# Patient Record
Sex: Female | Born: 1965 | Race: White | Hispanic: No | Smoking: Never smoker
Health system: Southern US, Community
[De-identification: ages and names within clinical notes are randomized; demographics above are authoritative.]

## PROBLEM LIST (undated history)

## (undated) DIAGNOSIS — R519 Headache, unspecified: Secondary | ICD-10-CM

## (undated) DIAGNOSIS — Z9889 Other specified postprocedural states: Secondary | ICD-10-CM

## (undated) DIAGNOSIS — K649 Unspecified hemorrhoids: Secondary | ICD-10-CM

## (undated) DIAGNOSIS — N938 Other specified abnormal uterine and vaginal bleeding: Secondary | ICD-10-CM

## (undated) DIAGNOSIS — F419 Anxiety disorder, unspecified: Secondary | ICD-10-CM

## (undated) DIAGNOSIS — R51 Headache: Secondary | ICD-10-CM

## (undated) DIAGNOSIS — E669 Obesity, unspecified: Secondary | ICD-10-CM

## (undated) DIAGNOSIS — Z973 Presence of spectacles and contact lenses: Secondary | ICD-10-CM

## (undated) DIAGNOSIS — N809 Endometriosis, unspecified: Secondary | ICD-10-CM

## (undated) DIAGNOSIS — R569 Unspecified convulsions: Secondary | ICD-10-CM

## (undated) DIAGNOSIS — R112 Nausea with vomiting, unspecified: Secondary | ICD-10-CM

## (undated) HISTORY — PX: BREAST SURGERY: SHX581

## (undated) HISTORY — PX: ABDOMINAL HYSTERECTOMY: SHX81

## (undated) HISTORY — PX: ABDOMINAL HYSTERECTOMY: SUR658

## (undated) HISTORY — PX: MULTIPLE TOOTH EXTRACTIONS: SHX2053

## (undated) HISTORY — PX: TONSILLECTOMY AND ADENOIDECTOMY: SHX28

## (undated) HISTORY — PX: WISDOM TOOTH EXTRACTION: SHX21

## (undated) HISTORY — DX: Unspecified hemorrhoids: K64.9

## (undated) HISTORY — PX: OTHER SURGICAL HISTORY: SHX169

## (undated) HISTORY — DX: Other specified abnormal uterine and vaginal bleeding: N93.8

## (undated) HISTORY — DX: Obesity, unspecified: E66.9

## (undated) HISTORY — DX: Headache, unspecified: R51.9

## (undated) HISTORY — PX: BREAST EXCISIONAL BIOPSY: SUR124

---

## 1997-08-13 ENCOUNTER — Emergency Department (HOSPITAL_COMMUNITY): Admission: EM | Admit: 1997-08-13 | Discharge: 1997-08-14 | Payer: Self-pay | Admitting: Family Medicine

## 1998-02-28 ENCOUNTER — Emergency Department (HOSPITAL_COMMUNITY): Admission: EM | Admit: 1998-02-28 | Discharge: 1998-02-28 | Payer: Self-pay | Admitting: Emergency Medicine

## 1998-03-02 ENCOUNTER — Emergency Department (HOSPITAL_COMMUNITY): Admission: EM | Admit: 1998-03-02 | Discharge: 1998-03-02 | Payer: Self-pay | Admitting: Emergency Medicine

## 1998-05-03 ENCOUNTER — Inpatient Hospital Stay (HOSPITAL_COMMUNITY): Admission: AD | Admit: 1998-05-03 | Discharge: 1998-05-03 | Payer: Self-pay | Admitting: Obstetrics

## 1998-09-10 ENCOUNTER — Emergency Department (HOSPITAL_COMMUNITY): Admission: EM | Admit: 1998-09-10 | Discharge: 1998-09-10 | Payer: Self-pay | Admitting: Emergency Medicine

## 1998-11-03 ENCOUNTER — Emergency Department (HOSPITAL_COMMUNITY): Admission: EM | Admit: 1998-11-03 | Discharge: 1998-11-04 | Payer: Self-pay | Admitting: Internal Medicine

## 1998-11-14 ENCOUNTER — Emergency Department (HOSPITAL_COMMUNITY): Admission: EM | Admit: 1998-11-14 | Discharge: 1998-11-14 | Payer: Self-pay | Admitting: *Deleted

## 1999-05-13 ENCOUNTER — Emergency Department (HOSPITAL_COMMUNITY): Admission: EM | Admit: 1999-05-13 | Discharge: 1999-05-13 | Payer: Self-pay

## 1999-08-07 ENCOUNTER — Emergency Department (HOSPITAL_COMMUNITY): Admission: EM | Admit: 1999-08-07 | Discharge: 1999-08-07 | Payer: Self-pay | Admitting: Internal Medicine

## 1999-10-05 ENCOUNTER — Emergency Department (HOSPITAL_COMMUNITY): Admission: EM | Admit: 1999-10-05 | Discharge: 1999-10-05 | Payer: Self-pay | Admitting: Emergency Medicine

## 1999-12-28 ENCOUNTER — Encounter: Admission: RE | Admit: 1999-12-28 | Discharge: 1999-12-28 | Payer: Self-pay | Admitting: Obstetrics & Gynecology

## 2000-03-23 ENCOUNTER — Emergency Department (HOSPITAL_COMMUNITY): Admission: EM | Admit: 2000-03-23 | Discharge: 2000-03-23 | Payer: Self-pay | Admitting: *Deleted

## 2000-03-23 ENCOUNTER — Encounter: Payer: Self-pay | Admitting: *Deleted

## 2000-03-25 ENCOUNTER — Emergency Department (HOSPITAL_COMMUNITY): Admission: EM | Admit: 2000-03-25 | Discharge: 2000-03-25 | Payer: Self-pay | Admitting: Emergency Medicine

## 2000-12-16 ENCOUNTER — Encounter: Admission: RE | Admit: 2000-12-16 | Discharge: 2000-12-16 | Payer: Self-pay

## 2001-02-06 ENCOUNTER — Encounter: Admission: RE | Admit: 2001-02-06 | Discharge: 2001-02-06 | Payer: Self-pay | Admitting: Obstetrics & Gynecology

## 2001-03-03 ENCOUNTER — Encounter: Admission: RE | Admit: 2001-03-03 | Discharge: 2001-03-03 | Payer: Self-pay | Admitting: Obstetrics & Gynecology

## 2001-03-06 ENCOUNTER — Encounter: Admission: RE | Admit: 2001-03-06 | Discharge: 2001-03-06 | Payer: Self-pay | Admitting: Obstetrics & Gynecology

## 2001-03-23 ENCOUNTER — Inpatient Hospital Stay (HOSPITAL_COMMUNITY): Admission: AD | Admit: 2001-03-23 | Discharge: 2001-03-23 | Payer: Self-pay | Admitting: Obstetrics

## 2001-03-27 ENCOUNTER — Inpatient Hospital Stay (HOSPITAL_COMMUNITY): Admission: AD | Admit: 2001-03-27 | Discharge: 2001-03-27 | Payer: Self-pay | Admitting: Obstetrics & Gynecology

## 2001-05-07 ENCOUNTER — Encounter: Admission: RE | Admit: 2001-05-07 | Discharge: 2001-05-07 | Payer: Self-pay | Admitting: Obstetrics and Gynecology

## 2002-11-23 ENCOUNTER — Emergency Department (HOSPITAL_COMMUNITY): Admission: EM | Admit: 2002-11-23 | Discharge: 2002-11-23 | Payer: Self-pay | Admitting: Emergency Medicine

## 2002-11-23 ENCOUNTER — Encounter: Payer: Self-pay | Admitting: Emergency Medicine

## 2003-01-08 ENCOUNTER — Emergency Department (HOSPITAL_COMMUNITY): Admission: EM | Admit: 2003-01-08 | Discharge: 2003-01-08 | Payer: Self-pay | Admitting: Emergency Medicine

## 2003-05-06 ENCOUNTER — Inpatient Hospital Stay (HOSPITAL_COMMUNITY): Admission: AD | Admit: 2003-05-06 | Discharge: 2003-05-06 | Payer: Self-pay | Admitting: *Deleted

## 2003-05-11 ENCOUNTER — Inpatient Hospital Stay (HOSPITAL_COMMUNITY): Admission: AD | Admit: 2003-05-11 | Discharge: 2003-05-11 | Payer: Self-pay | Admitting: Obstetrics and Gynecology

## 2004-02-29 ENCOUNTER — Emergency Department (HOSPITAL_COMMUNITY): Admission: EM | Admit: 2004-02-29 | Discharge: 2004-03-01 | Payer: Self-pay | Admitting: Emergency Medicine

## 2004-03-10 ENCOUNTER — Inpatient Hospital Stay (HOSPITAL_COMMUNITY): Admission: AD | Admit: 2004-03-10 | Discharge: 2004-03-10 | Payer: Self-pay | Admitting: *Deleted

## 2004-03-27 ENCOUNTER — Ambulatory Visit: Payer: Self-pay | Admitting: Obstetrics & Gynecology

## 2004-04-12 ENCOUNTER — Emergency Department (HOSPITAL_COMMUNITY): Admission: EM | Admit: 2004-04-12 | Discharge: 2004-04-12 | Payer: Self-pay | Admitting: Family Medicine

## 2004-04-16 ENCOUNTER — Ambulatory Visit: Payer: Self-pay | Admitting: Internal Medicine

## 2004-11-25 HISTORY — PX: LAPAROSCOPY: SHX197

## 2004-11-26 ENCOUNTER — Ambulatory Visit: Payer: Self-pay | Admitting: Hospitalist

## 2004-11-28 ENCOUNTER — Inpatient Hospital Stay (HOSPITAL_COMMUNITY): Admission: AD | Admit: 2004-11-28 | Discharge: 2004-11-28 | Payer: Self-pay | Admitting: Obstetrics and Gynecology

## 2004-11-30 ENCOUNTER — Ambulatory Visit (HOSPITAL_COMMUNITY): Admission: AD | Admit: 2004-11-30 | Discharge: 2004-11-30 | Payer: Self-pay | Admitting: *Deleted

## 2004-11-30 ENCOUNTER — Ambulatory Visit: Payer: Self-pay | Admitting: *Deleted

## 2004-12-03 ENCOUNTER — Inpatient Hospital Stay (HOSPITAL_COMMUNITY): Admission: AD | Admit: 2004-12-03 | Discharge: 2004-12-03 | Payer: Self-pay | Admitting: Obstetrics & Gynecology

## 2004-12-06 ENCOUNTER — Ambulatory Visit: Payer: Self-pay | Admitting: Family Medicine

## 2004-12-06 ENCOUNTER — Ambulatory Visit (HOSPITAL_COMMUNITY): Admission: RE | Admit: 2004-12-06 | Discharge: 2004-12-06 | Payer: Self-pay | Admitting: Family Medicine

## 2004-12-12 ENCOUNTER — Inpatient Hospital Stay (HOSPITAL_COMMUNITY): Admission: AD | Admit: 2004-12-12 | Discharge: 2004-12-12 | Payer: Self-pay | Admitting: Obstetrics and Gynecology

## 2004-12-19 ENCOUNTER — Ambulatory Visit: Payer: Self-pay | Admitting: *Deleted

## 2004-12-24 ENCOUNTER — Inpatient Hospital Stay (HOSPITAL_COMMUNITY): Admission: AD | Admit: 2004-12-24 | Discharge: 2004-12-24 | Payer: Self-pay | Admitting: Family Medicine

## 2005-01-03 ENCOUNTER — Ambulatory Visit: Payer: Self-pay | Admitting: Family Medicine

## 2005-01-03 ENCOUNTER — Ambulatory Visit (HOSPITAL_COMMUNITY): Admission: RE | Admit: 2005-01-03 | Discharge: 2005-01-03 | Payer: Self-pay | Admitting: Obstetrics and Gynecology

## 2005-01-05 ENCOUNTER — Inpatient Hospital Stay (HOSPITAL_COMMUNITY): Admission: AD | Admit: 2005-01-05 | Discharge: 2005-01-05 | Payer: Self-pay | Admitting: Obstetrics and Gynecology

## 2005-01-07 ENCOUNTER — Ambulatory Visit: Payer: Self-pay | Admitting: Obstetrics and Gynecology

## 2005-01-24 ENCOUNTER — Ambulatory Visit: Payer: Self-pay | Admitting: Family Medicine

## 2005-02-03 ENCOUNTER — Inpatient Hospital Stay (HOSPITAL_COMMUNITY): Admission: AD | Admit: 2005-02-03 | Discharge: 2005-02-03 | Payer: Self-pay | Admitting: *Deleted

## 2005-02-05 ENCOUNTER — Inpatient Hospital Stay (HOSPITAL_COMMUNITY): Admission: AD | Admit: 2005-02-05 | Discharge: 2005-02-05 | Payer: Self-pay | Admitting: *Deleted

## 2005-02-13 ENCOUNTER — Ambulatory Visit (HOSPITAL_COMMUNITY): Admission: RE | Admit: 2005-02-13 | Discharge: 2005-02-13 | Payer: Self-pay | Admitting: *Deleted

## 2005-02-13 ENCOUNTER — Ambulatory Visit: Payer: Self-pay | Admitting: *Deleted

## 2005-02-21 ENCOUNTER — Ambulatory Visit: Payer: Self-pay | Admitting: Family Medicine

## 2005-03-04 ENCOUNTER — Ambulatory Visit: Payer: Self-pay | Admitting: Obstetrics & Gynecology

## 2005-03-04 ENCOUNTER — Inpatient Hospital Stay (HOSPITAL_COMMUNITY): Admission: AD | Admit: 2005-03-04 | Discharge: 2005-03-04 | Payer: Self-pay | Admitting: Obstetrics & Gynecology

## 2005-03-14 ENCOUNTER — Ambulatory Visit: Payer: Self-pay | Admitting: Family Medicine

## 2005-03-14 ENCOUNTER — Ambulatory Visit (HOSPITAL_COMMUNITY): Admission: RE | Admit: 2005-03-14 | Discharge: 2005-03-14 | Payer: Self-pay | Admitting: *Deleted

## 2005-04-01 ENCOUNTER — Inpatient Hospital Stay (HOSPITAL_COMMUNITY): Admission: AD | Admit: 2005-04-01 | Discharge: 2005-04-01 | Payer: Self-pay | Admitting: Obstetrics and Gynecology

## 2005-04-04 ENCOUNTER — Ambulatory Visit: Payer: Self-pay | Admitting: Family Medicine

## 2005-04-25 ENCOUNTER — Ambulatory Visit: Payer: Self-pay | Admitting: Family Medicine

## 2005-05-09 ENCOUNTER — Ambulatory Visit: Payer: Self-pay | Admitting: *Deleted

## 2005-05-19 ENCOUNTER — Ambulatory Visit: Payer: Self-pay | Admitting: Obstetrics & Gynecology

## 2005-05-19 ENCOUNTER — Inpatient Hospital Stay (HOSPITAL_COMMUNITY): Admission: AD | Admit: 2005-05-19 | Discharge: 2005-05-19 | Payer: Self-pay | Admitting: Obstetrics & Gynecology

## 2005-05-23 ENCOUNTER — Ambulatory Visit: Payer: Self-pay | Admitting: Gynecology

## 2005-05-27 ENCOUNTER — Ambulatory Visit: Payer: Self-pay | Admitting: Obstetrics and Gynecology

## 2005-05-27 ENCOUNTER — Inpatient Hospital Stay (HOSPITAL_COMMUNITY): Admission: AD | Admit: 2005-05-27 | Discharge: 2005-05-27 | Payer: Self-pay | Admitting: *Deleted

## 2005-05-30 ENCOUNTER — Ambulatory Visit (HOSPITAL_COMMUNITY): Admission: RE | Admit: 2005-05-30 | Discharge: 2005-05-30 | Payer: Self-pay | Admitting: *Deleted

## 2005-05-30 ENCOUNTER — Ambulatory Visit: Payer: Self-pay | Admitting: Gynecology

## 2005-06-06 ENCOUNTER — Ambulatory Visit: Payer: Self-pay | Admitting: *Deleted

## 2005-06-10 ENCOUNTER — Inpatient Hospital Stay (HOSPITAL_COMMUNITY): Admission: AD | Admit: 2005-06-10 | Discharge: 2005-06-10 | Payer: Self-pay | Admitting: *Deleted

## 2005-06-10 ENCOUNTER — Ambulatory Visit: Payer: Self-pay | Admitting: Obstetrics and Gynecology

## 2005-06-13 ENCOUNTER — Ambulatory Visit: Payer: Self-pay | Admitting: Gynecology

## 2005-06-19 ENCOUNTER — Ambulatory Visit: Payer: Self-pay | Admitting: Family Medicine

## 2005-06-19 ENCOUNTER — Inpatient Hospital Stay (HOSPITAL_COMMUNITY): Admission: AD | Admit: 2005-06-19 | Discharge: 2005-06-19 | Payer: Self-pay | Admitting: Gynecology

## 2005-06-20 ENCOUNTER — Ambulatory Visit: Payer: Self-pay | Admitting: *Deleted

## 2005-06-26 ENCOUNTER — Ambulatory Visit (HOSPITAL_COMMUNITY): Admission: RE | Admit: 2005-06-26 | Discharge: 2005-06-26 | Payer: Self-pay | Admitting: Gynecology

## 2005-06-27 ENCOUNTER — Ambulatory Visit: Payer: Self-pay | Admitting: Gynecology

## 2005-07-04 ENCOUNTER — Ambulatory Visit: Payer: Self-pay | Admitting: Gynecology

## 2005-07-07 ENCOUNTER — Inpatient Hospital Stay (HOSPITAL_COMMUNITY): Admission: AD | Admit: 2005-07-07 | Discharge: 2005-07-07 | Payer: Self-pay | Admitting: Obstetrics and Gynecology

## 2005-07-11 ENCOUNTER — Ambulatory Visit: Payer: Self-pay | Admitting: Gynecology

## 2005-07-15 ENCOUNTER — Ambulatory Visit: Payer: Self-pay | Admitting: Obstetrics & Gynecology

## 2005-07-16 ENCOUNTER — Inpatient Hospital Stay (HOSPITAL_COMMUNITY): Admission: AD | Admit: 2005-07-16 | Discharge: 2005-07-16 | Payer: Self-pay | Admitting: Obstetrics and Gynecology

## 2005-07-17 ENCOUNTER — Inpatient Hospital Stay (HOSPITAL_COMMUNITY): Admission: AD | Admit: 2005-07-17 | Discharge: 2005-07-17 | Payer: Self-pay | Admitting: Family Medicine

## 2005-07-17 ENCOUNTER — Ambulatory Visit: Payer: Self-pay | Admitting: Obstetrics and Gynecology

## 2005-07-18 ENCOUNTER — Ambulatory Visit: Payer: Self-pay | Admitting: Family Medicine

## 2005-07-18 ENCOUNTER — Ambulatory Visit (HOSPITAL_COMMUNITY): Admission: RE | Admit: 2005-07-18 | Discharge: 2005-07-18 | Payer: Self-pay | Admitting: Gynecology

## 2005-07-23 ENCOUNTER — Ambulatory Visit: Payer: Self-pay | Admitting: Obstetrics and Gynecology

## 2005-07-24 ENCOUNTER — Inpatient Hospital Stay (HOSPITAL_COMMUNITY): Admission: AD | Admit: 2005-07-24 | Discharge: 2005-07-24 | Payer: Self-pay | Admitting: Gynecology

## 2005-07-24 ENCOUNTER — Ambulatory Visit: Payer: Self-pay | Admitting: *Deleted

## 2005-07-25 ENCOUNTER — Ambulatory Visit: Payer: Self-pay | Admitting: Gynecology

## 2005-07-25 ENCOUNTER — Inpatient Hospital Stay (HOSPITAL_COMMUNITY): Admission: AD | Admit: 2005-07-25 | Discharge: 2005-07-28 | Payer: Self-pay | Admitting: Family Medicine

## 2005-07-25 ENCOUNTER — Ambulatory Visit: Payer: Self-pay | Admitting: Obstetrics and Gynecology

## 2005-07-26 HISTORY — PX: TUBAL LIGATION: SHX77

## 2005-07-27 ENCOUNTER — Encounter (INDEPENDENT_AMBULATORY_CARE_PROVIDER_SITE_OTHER): Payer: Self-pay | Admitting: *Deleted

## 2006-02-18 ENCOUNTER — Emergency Department (HOSPITAL_COMMUNITY): Admission: EM | Admit: 2006-02-18 | Discharge: 2006-02-18 | Payer: Self-pay | Admitting: Emergency Medicine

## 2006-05-30 ENCOUNTER — Emergency Department (HOSPITAL_COMMUNITY): Admission: EM | Admit: 2006-05-30 | Discharge: 2006-05-30 | Payer: Self-pay | Admitting: Emergency Medicine

## 2006-12-09 ENCOUNTER — Inpatient Hospital Stay (HOSPITAL_COMMUNITY): Admission: AD | Admit: 2006-12-09 | Discharge: 2006-12-09 | Payer: Self-pay | Admitting: Obstetrics & Gynecology

## 2007-03-06 ENCOUNTER — Emergency Department (HOSPITAL_COMMUNITY): Admission: EM | Admit: 2007-03-06 | Discharge: 2007-03-06 | Payer: Self-pay | Admitting: Emergency Medicine

## 2007-04-03 ENCOUNTER — Inpatient Hospital Stay (HOSPITAL_COMMUNITY): Admission: AD | Admit: 2007-04-03 | Discharge: 2007-04-03 | Payer: Self-pay | Admitting: Obstetrics & Gynecology

## 2007-10-17 ENCOUNTER — Emergency Department (HOSPITAL_COMMUNITY): Admission: EM | Admit: 2007-10-17 | Discharge: 2007-10-17 | Payer: Self-pay | Admitting: Emergency Medicine

## 2010-07-13 NOTE — Op Note (Signed)
Erin Travis, Erin Travis              ACCOUNT NO.:  [id number]   MEDICAL RECORD NO.:  [id number]          PATIENT TYPE:  AMB   LOCATION:  SDC                           FACILITY:  [facility]   PHYSICIAN:  Ginger Carne, MD  DATE OF BIRTH:  25-Aug-1965   DATE OF PROCEDURE:  11/30/2004  DATE OF DISCHARGE:                                 OPERATIVE REPORT   PREOPERATIVE DIAGNOSIS:  Possible left tubal pregnancy.   POSTOPERATIVE DIAGNOSES:  1.  Normal pelvis.  2.  4-5 week intrauterine pregnancy.   PROCEDURE:  Diagnostic laparoscopy.   SURGEON:  Ginger Carne, MD.   ASSISTANT:  None.   COMPLICATIONS:  None immediate.   ESTIMATED BLOOD LOSS:  Minimal.   ANESTHESIA:  General.   FINDINGS:  The external genitalia, vulva and vagina normal. Cervix smooth  without erosions or lesions. The uterus appeared to be soft, about 4-6 weeks  in size. Both tubes and ovaries demonstrated normal decidual changes of  pregnancy. Both tubes were observed from their isthmus to the fimbriated end  without evidence of dilation to suggest a tubal pregnancy. The cul-de-sac  was normal, no free blood noted. Upper abdomen normal.   DESCRIPTION OF PROCEDURE:  The patient was prepped and draped in the usual  fashion and placed in the lithotomy position. Betadine solution used for  antiseptic and the patient was catheterized prior to procedure. After  adequate general anesthesia, a vertical infraumbilical incision was made and  the Veress needle placed in the abdomen. Opening and closing pressures were  10-15 mmHg. A needle release trocar placed in the same incision, laparoscope  placed in the trocar sleeve. Two 5 mm ports were made in the left lower  quadrant and left hypogastric regions. Inspection of the pelvic contents was  carried out. Photography taken after which gas release trocar was removed,  closure of the 10 mm fascial site with #0 Vicryl suture and 4-0 Vicryl for  subcuticular closure. Instrument  and sponge count were correct. The patient  tolerated the procedure well and was transferred to post anesthesia recovery  room in excellent condition.     Ginger Carne, MD  Electronically Signed    SHB/MEDQ  D:  11/30/2004  T:  11/30/2004  Job:  [id number]

## 2010-07-13 NOTE — Op Note (Signed)
Erin Travis, Erin Travis              ACCOUNT NO.:  [id number]   MEDICAL RECORD NO.:  [id number]          PATIENT TYPE:  INP   LOCATION:                                [facility]ILITY:  [facility]   PHYSICIAN:  Phil D. Okey Dupre, M.D.     DATE OF BIRTH:  06-17-1965   DATE OF PROCEDURE:  07/27/2005  DATE OF DISCHARGE:                                 OPERATIVE REPORT   PROCEDURE:  Bilateral tubal ligation and partial bilateral salpingectomy,  modified [place].   PREOPERATIVE DIAGNOSIS:  Voluntary sterilization.   POSTOPERATIVE DIAGNOSIS:  Voluntary sterilization.   SURGEON:  Dr. Okey Dupre.   ANESTHESIA:  Epidural plus local pathology.   SPECIMENS:  Partial section of each fallopian tube.   ESTIMATED BLOOD LOSS:  Five mL.   POSTOPERATIVE CONDITION:  Satisfactory.   PROCEDURE:  Under satisfactory epidural anesthesia with the patient in  dorsal supine position, the epidural was supplemented by 1/2% Marcaine  injection 20 mL into the surgical site just below the umbilicus after the  abdomen had been prepped and draped in the usual sterile manner.  The  abdomen was entered through a 4 cm subumbilical incision situated  approximately 1 cm below the umbilicus and taken down to the fascia.  When  the fascia was opened transversely, the peritoneum was then opened by sharp  dissection.  The fallopian tubes were easily identified, grasped with  Babcock clamps, followed down to the fimbriated end of each tube for certain  identification and a hemostat placed through an avascular portion of the  broad ligament through which was drawn a one plain catgut suture and tied  down around the distal and proximal ends of the tube to form a loop above  the tie of approximately 2 cm in length.  A second tie was placed just below  the aforementioned tie and cut short.  A section of tube above the ties was  excised and the free ends coagulated with hot cautery.  No bleeding was  noted and the tubes were returned into the  peritoneal cavity.  The  peritoneum was closed with continuous running zero Vicryl which was  continued up to a fascial closure before being tied.  Skin edges were then  approximated with a 3-0 Monocryl suture supplemented by Dermaplast.  Dry  sterile dressings applied.  The patient tolerated the procedure well and was  transferred recovery room in satisfactory condition.           ______________________________  Javier Glazier Okey Dupre, M.D.     PDR/MEDQ  D:  07/27/2005  T:  07/29/2005  Job:  [id number]

## 2010-07-13 NOTE — Group Therapy Note (Signed)
NAMEDEVENEY, BAYON NO.:  [id number]   MEDICAL RECORD NO.:  [id number]          PATIENT TYPE:  WOC   LOCATION:  [facility]                   FACILITY:  [facility]   PHYSICIAN:  Argentina Donovan, MD        DATE OF BIRTH:  Jul 14, 1965   DATE OF SERVICE:  03/27/2004                                    CLINIC NOTE   REASON FOR VISIT:  The patient is a 45 year old white female gravida 1 para  1-0-0-1 with a child age 16 who is seen in the MAU because of dysfunctional  uterine bleeding.  Her heavy periods have not been reflected in her  hemoglobin, which has been normal.  She talks about bloating, pelvic pain  prior to her period, and then a relief when the period finally comes, but  then the period goes on for 7-8 days and is very heavy.  She is a nonsmoker  but has been told that when she goes on birth control pills her blood  pressure goes up.  She was on Depo-Provera and bled the entire time.  We  have discussed surgery versus treatment with oral contraceptives.  I thought  that we would probably start her on oral contraceptives on a higher dose,  Ovcon-35, to make sure this would control the bleeding, and at the same time  start her on hydrochlorothiazide 25 for blood pressure control.  We are  going to have her come back for blood pressure check and discussion in  approximately 1 month after she has been on the medications.  Ultrasound of  the pelvis is completely normal.  The abdomen is soft, flat, nontender.  No  masses, no organomegaly.  The external genitalia is normal, BUS within  normal limits.  The vagina is clean and well rugated, the cervix is clean  and parous.  The uterus is anterior, normal size, shape, consistency, and  adnexa is normal.   My impression is this patient has dysfunctional uterine bleeding with PMDD.  It may be necessary to add Prozac or some other antidepressant along with  the other medications and adjust the oral contraceptives and the blood  pressure medication when she is re-seen.      PR/MEDQ  D:  03/27/2004  T:  03/27/2004  Job:  [id number]

## 2010-11-16 LAB — URINALYSIS, ROUTINE W REFLEX MICROSCOPIC
Glucose, UA: NEGATIVE
Ketones, ur: NEGATIVE
Nitrite: NEGATIVE
Protein, ur: NEGATIVE
Specific Gravity, Urine: >1.03 — ABNORMAL HIGH
Urobilinogen, UA: 0.2
pH: 6.5

## 2010-11-16 LAB — URINE MICROSCOPIC-ADD ON

## 2010-11-16 LAB — HERPES SIMPLEX VIRUS CULTURE: Culture: NOT DETECTED

## 2010-11-16 LAB — GC/CHLAMYDIA PROBE AMP, GENITAL
Chlamydia, DNA Probe: NEGATIVE
GC Probe Amp, Genital: NEGATIVE

## 2010-11-16 LAB — WET PREP, GENITAL
Clue Cells Wet Prep HPF POC: NONE SEEN
Trich, Wet Prep: NONE SEEN

## 2010-12-06 LAB — URINALYSIS, ROUTINE W REFLEX MICROSCOPIC
Bilirubin Urine: NEGATIVE
Glucose, UA: NEGATIVE
Ketones, ur: NEGATIVE
Leukocytes, UA: NEGATIVE
Nitrite: NEGATIVE
Protein, ur: NEGATIVE
Specific Gravity, Urine: 1.025
Urobilinogen, UA: 0.2
pH: 5.5

## 2010-12-06 LAB — WET PREP, GENITAL
Clue Cells Wet Prep HPF POC: NONE SEEN
Trich, Wet Prep: NONE SEEN
Yeast Wet Prep HPF POC: NONE SEEN

## 2010-12-06 LAB — URINE MICROSCOPIC-ADD ON

## 2010-12-06 LAB — CBC
HCT: 34.1 — ABNORMAL LOW
Hemoglobin: 11.3 — ABNORMAL LOW
MCHC: 33.3
MCV: 79.2
Platelets: 289
RBC: 4.3
RDW: 15.9 — ABNORMAL HIGH
WBC: 7.8

## 2010-12-06 LAB — POCT PREGNANCY, URINE
Operator id: 113551
Preg Test, Ur: NEGATIVE

## 2010-12-06 LAB — GC/CHLAMYDIA PROBE AMP, GENITAL
Chlamydia, DNA Probe: NEGATIVE
GC Probe Amp, Genital: NEGATIVE

## 2010-12-27 ENCOUNTER — Inpatient Hospital Stay (HOSPITAL_COMMUNITY)
Admission: AD | Admit: 2010-12-27 | Discharge: 2010-12-28 | Disposition: A | Discharge status: Home / Self Care | Payer: Self-pay | Source: Ambulatory Visit | Attending: Obstetrics & Gynecology | Admitting: Obstetrics & Gynecology

## 2010-12-27 ENCOUNTER — Encounter (HOSPITAL_COMMUNITY): Payer: Self-pay | Admitting: *Deleted

## 2010-12-27 DIAGNOSIS — N938 Other specified abnormal uterine and vaginal bleeding: Secondary | ICD-10-CM | POA: Insufficient documentation

## 2010-12-27 DIAGNOSIS — N949 Unspecified condition associated with female genital organs and menstrual cycle: Secondary | ICD-10-CM | POA: Insufficient documentation

## 2010-12-27 DIAGNOSIS — N925 Other specified irregular menstruation: Secondary | ICD-10-CM | POA: Insufficient documentation

## 2010-12-27 DIAGNOSIS — D259 Leiomyoma of uterus, unspecified: Secondary | ICD-10-CM | POA: Insufficient documentation

## 2010-12-27 LAB — CBC
HCT: 32.5 % — ABNORMAL LOW (ref 36.0–46.0)
Hemoglobin: 10 g/dL — ABNORMAL LOW (ref 12.0–15.0)
MCH: 23.7 pg — ABNORMAL LOW (ref 26.0–34.0)
MCHC: 30.8 g/dL (ref 30.0–36.0)
MCV: 77 fL — ABNORMAL LOW (ref 78.0–100.0)
Platelets: 271 10*3/uL (ref 150–400)
RBC: 4.22 MIL/uL (ref 3.87–5.11)
RDW: 16.7 % — ABNORMAL HIGH (ref 11.5–15.5)
WBC: 8.2 10*3/uL (ref 4.0–10.5)

## 2010-12-27 LAB — POCT PREGNANCY, URINE: Preg Test, Ur: NEGATIVE

## 2010-12-27 NOTE — Progress Notes (Signed)
SAYS ON CYCLE NOW - X3 DAYS.  SAYS SINCE HAD BTL- CYCLES HEAVY-  TONIGHT PASSING CLOTS- AND BLEEDING HEAVY AND PAIN-   TYLENOL- AT 6PM.  WENT TO CLINIC FOR P[facility]-  HAS NOT SEEN DR Lyndal Pulley.

## 2010-12-27 NOTE — ED Provider Notes (Signed)
History     No chief complaint on file.  HPI Heavy period x 3 days, left sided and suprapubic pain all day today. No relief with tylenol. Periods have been heavy x 5 years since BTL.   OB History    Grav Para Term Preterm Abortions TAB SAB Ect Mult Living   2 2        2       No past medical history on file.  Past Surgical History  Procedure Date  . Tubal ligation     No family history on file.  History  Substance Use Topics  . Smoking status: Never Smoker   . Smokeless tobacco: Never Used  . Alcohol Use: No    Allergies: Allergies not on file  No prescriptions prior to admission    Review of Systems  Constitutional: Negative.   Respiratory: Negative.   Cardiovascular: Negative.   Gastrointestinal: Positive for abdominal pain. Negative for nausea, vomiting, diarrhea and constipation.  Genitourinary: Negative for dysuria, urgency, frequency, hematuria and flank pain.       Positive for bleeding  Musculoskeletal: Negative.   Neurological: Negative.   Psychiatric/Behavioral: Negative.    Physical Exam   Blood pressure 134/48, pulse 69, temperature 98.6 F (37 C), temperature source Oral, resp. rate 18, height 5\' 6"  (1.676 m), weight 90.039 kg (198 lb 8 oz).  Physical Exam  Constitutional: She is oriented to person, place, and time. She appears well-developed and well-nourished. No distress.  HENT:  Head: Normocephalic and atraumatic.  Cardiovascular: Normal rate, regular rhythm and normal heart sounds.   Respiratory: Effort normal and breath sounds normal. No respiratory distress.  GI: Soft. Bowel sounds are normal. She exhibits no distension and no mass. There is no tenderness. There is no rebound and no guarding.  Genitourinary: There is no rash or lesion on the right labia. There is no rash or lesion on the left labia. Uterus is tender. Uterus is not deviated, not enlarged and not fixed. Cervix exhibits no motion tenderness, no discharge and no friability.  Right adnexum displays no mass, no tenderness and no fullness. Left adnexum displays tenderness. Left adnexum displays no mass and no fullness. There is bleeding (moderate) around the vagina. No erythema or tenderness around the vagina. No vaginal discharge found.  Neurological: She is alert and oriented to person, place, and time.  Skin: Skin is warm and dry.  Psychiatric: She has a normal mood and affect.    MAU Course  Procedures  Results for orders placed during the hospital encounter of 12/27/10 (from the past 24 hour(s))  CBC     Status: Abnormal   Collection Time   12/27/10 11:32 PM      Component Value Range   WBC 8.2  4.0 - 10.5 (K/uL)   RBC 4.22  3.87 - 5.11 (MIL/uL)   Hemoglobin 10.0 (*) 12.0 - 15.0 (g/dL)   HCT 16.1 (*) 09.6 - 46.0 (%)   MCV 77.0 (*) 78.0 - 100.0 (fL)   MCH 23.7 (*) 26.0 - 34.0 (pg)   MCHC 30.8  30.0 - 36.0 (g/dL)   RDW 04.5 (*) 40.9 - 15.5 (%)   Platelets 271  150 - 400 (K/uL)  POCT PREGNANCY, URINE     Status: Normal   Collection Time   12/27/10 11:35 PM      Component Value Range   Preg Test, Ur NEGATIVE      US Transvaginal Non-ob  12/28/2010  *RADIOLOGY REPORT*  Clinical Data: Vaginal bleeding.  LMP 12/24/2010  TRANSABDOMINAL AND TRANSVAGINAL ULTRASOUND OF PELVIS Technique:  Both transabdominal and transvaginal ultrasound examinations of the pelvis were performed. Transabdominal technique was performed for global imaging of the pelvis including uterus, ovaries, adnexal regions, and pelvic cul-de-sac.  Comparison: 07/18/2005   It was necessary to proceed with endovaginal exam following the transabdominal exam to visualize the endometrium and ovaries.  Findings:  Uterus: 11.6 x 5.8 x 7.4 cm.  Small left sided fibroid measuring 10 x 12 mm.  Slightly heterogeneous myometrium without other fibroids identified.  Endometrium: 4 mm in thickness.  Normal  Right ovary:  Normal appearance/no adnexal mass  Left ovary: Normal appearance/no adnexal mass  Other  findings: No free fluid  IMPRESSION: Small uterine fibroid.  Otherwise negative.  Original Report Authenticated By: Camelia Phenes, M.D.   US Pelvis Complete  12/28/2010  *RADIOLOGY REPORT*  Clinical Data: Vaginal bleeding.  LMP 12/24/2010  TRANSABDOMINAL AND TRANSVAGINAL ULTRASOUND OF PELVIS Technique:  Both transabdominal and transvaginal ultrasound examinations of the pelvis were performed. Transabdominal technique was performed for global imaging of the pelvis including uterus, ovaries, adnexal regions, and pelvic cul-de-sac.  Comparison: 07/18/2005   It was necessary to proceed with endovaginal exam following the transabdominal exam to visualize the endometrium and ovaries.  Findings:  Uterus: 11.6 x 5.8 x 7.4 cm.  Small left sided fibroid measuring 10 x 12 mm.  Slightly heterogeneous myometrium without other fibroids identified.  Endometrium: 4 mm in thickness.  Normal  Right ovary:  Normal appearance/no adnexal mass  Left ovary: Normal appearance/no adnexal mass  Other findings: No free fluid  IMPRESSION: Small uterine fibroid.  Otherwise negative.  Original Report Authenticated By: Camelia Phenes, M.D.   Assessment and Plan  DUB, small fibroid Rx Motrin 600 mg q6h  F/U in gyn clinic - needs annual exam  Erin Travis 12/27/2010, 11:49 PM

## 2010-12-27 NOTE — Progress Notes (Signed)
IN TRIAGE- PAD  ON  MOD AMT RED BLEEDING.

## 2010-12-28 ENCOUNTER — Inpatient Hospital Stay (HOSPITAL_COMMUNITY): Payer: Self-pay

## 2010-12-28 LAB — WET PREP, GENITAL
Clue Cells Wet Prep HPF POC: NONE SEEN
Trich, Wet Prep: NONE SEEN
Yeast Wet Prep HPF POC: NONE SEEN

## 2010-12-28 LAB — GC/CHLAMYDIA PROBE AMP, GENITAL
Chlamydia, DNA Probe: NEGATIVE
GC Probe Amp, Genital: NEGATIVE

## 2010-12-28 MED ORDER — IBUPROFEN 200 MG PO TABS: 600.0000 mg | ORAL_TABLET | Freq: Four times a day (QID) | ORAL | Status: DC | PRN

## 2010-12-28 MED ORDER — KETOROLAC TROMETHAMINE 60 MG/2ML IM SOLN
60.0000 mg | Freq: Once | INTRAMUSCULAR | Status: AC
  Administered 2010-12-28: 60 mg via INTRAMUSCULAR
  Filled 2010-12-28: qty 2

## 2010-12-31 NOTE — ED Provider Notes (Signed)
Attestation of Attending Supervision of Advanced Practitioner: Evaluation and management procedures were performed by the PA/NP/CNM/OB Fellow under my supervision/collaboration. Chart reviewed, and agree with management and plan.  Jaynie Collins A M.D. 12/31/2010 11:13 AM

## 2011-01-30 ENCOUNTER — Encounter: Payer: Self-pay | Admitting: Advanced Practice Midwife

## 2011-02-15 ENCOUNTER — Ambulatory Visit (INDEPENDENT_AMBULATORY_CARE_PROVIDER_SITE_OTHER): Payer: Self-pay | Admitting: Obstetrics & Gynecology

## 2011-02-15 ENCOUNTER — Encounter: Payer: Self-pay | Admitting: Obstetrics & Gynecology

## 2011-02-15 VITALS — BP 128/67 | HR 72 | Temp 97.0°F | Ht 67.5 in | Wt 197.3 lb

## 2011-02-15 DIAGNOSIS — D649 Anemia, unspecified: Secondary | ICD-10-CM

## 2011-02-15 DIAGNOSIS — N920 Excessive and frequent menstruation with regular cycle: Secondary | ICD-10-CM

## 2011-02-15 LAB — POCT PREGNANCY, URINE: Preg Test, Ur: NEGATIVE

## 2011-02-15 NOTE — Progress Notes (Signed)
Pt c/o nerve problems on right side radiates down the hip.   Pt c/o pain during intercourse.

## 2011-02-15 NOTE — Progress Notes (Signed)
  Subjective:    Patient ID: Erin Travis, female    DOB: October 12, 1965, 45 y.o.   MRN: [id number]  HPI  Erin Travis complains of extremely heavy periods since her BTL 5 years ago. She bleeds very heavily each month for 3 days and then lighter for 2 more hours. She would like a hysterectomy and is here today for a EMBX.  Review of Systems     Objective:   Physical Exam  Uterus is 12 week size on exam and sounds to 12 cm. She has an excellent pelvis for deliveries. No adnexal masses palpated. I prepped her cervix with betadine and did a Pipelle biopsy with a large amount of tissue obtained. She tolerated the procedure well.      Assessment & Plan:  GSUI Menorrhagia and anemia. Plan for robotic hysterectomy and TVT.

## 2011-02-15 NOTE — Progress Notes (Signed)
Addended by: Lynnell Dike on: 02/15/2011 11:09 AM   Modules accepted: Orders

## 2011-02-20 NOTE — Progress Notes (Signed)
Addended by: Lynnell Dike on: 02/20/2011 08:21 AM   Modules accepted: Orders

## 2011-03-04 ENCOUNTER — Telehealth: Payer: Self-pay | Admitting: *Deleted

## 2011-03-04 NOTE — Telephone Encounter (Signed)
Pt left message requesting results from last visit.

## 2011-03-04 NOTE — Telephone Encounter (Signed)
Spoke with patient and gave her endo bx results. She will keep her upcoming appt for surgery.

## 2011-03-27 ENCOUNTER — Ambulatory Visit (INDEPENDENT_AMBULATORY_CARE_PROVIDER_SITE_OTHER): Payer: Self-pay | Admitting: Obstetrics & Gynecology

## 2011-03-27 ENCOUNTER — Encounter: Payer: Self-pay | Admitting: Obstetrics & Gynecology

## 2011-03-27 DIAGNOSIS — N939 Abnormal uterine and vaginal bleeding, unspecified: Secondary | ICD-10-CM

## 2011-03-27 DIAGNOSIS — N938 Other specified abnormal uterine and vaginal bleeding: Secondary | ICD-10-CM

## 2011-03-27 DIAGNOSIS — N926 Irregular menstruation, unspecified: Secondary | ICD-10-CM

## 2011-03-27 DIAGNOSIS — D649 Anemia, unspecified: Secondary | ICD-10-CM

## 2011-03-27 NOTE — Progress Notes (Signed)
Reports has something on right ovary

## 2011-03-27 NOTE — Progress Notes (Signed)
  Subjective:    Patient ID: Erin Travis, female    DOB: 04-Jan-1966, 46 y.o.   MRN: [id number]  HPI Ms. Erin Travis is here today to go over questions about surgery  (RATH). She would like it shceduled ASAP. She wants to take off about 2 weeks from work from [employer]. She denies GSUI or other medical issues.   Review of Systems     Objective:   Physical Exam        Assessment & Plan:  DUB, anemia, fibroid- Plan for RATH, cystoscopy. I have discussed risks of surgery in general as well as specific risks associated with RATH (Trendelenburg position, 1% cuff dehiscence). She wishes to procede. I have recommended magnesium citrate the night before surgery and have also stressed pelvic rest of 2 months post op.

## 2011-03-28 LAB — TSH: TSH: 0.814 u[IU]/mL (ref 0.350–4.500)

## 2011-04-15 ENCOUNTER — Encounter (HOSPITAL_COMMUNITY)
Admission: RE | Admit: 2011-04-15 | Discharge: 2011-04-15 | Disposition: A | Discharge status: Home / Self Care | Payer: Self-pay | Source: Ambulatory Visit | Attending: Obstetrics & Gynecology | Admitting: Obstetrics & Gynecology

## 2011-04-15 ENCOUNTER — Encounter (HOSPITAL_COMMUNITY): Payer: Self-pay

## 2011-04-15 HISTORY — DX: Other specified postprocedural states: Z98.890

## 2011-04-15 HISTORY — DX: Anxiety disorder, unspecified: F41.9

## 2011-04-15 HISTORY — DX: Unspecified convulsions: R56.9

## 2011-04-15 HISTORY — DX: Nausea with vomiting, unspecified: R11.2

## 2011-04-15 HISTORY — DX: Headache: R51

## 2011-04-15 LAB — CBC
HCT: 33 % — ABNORMAL LOW (ref 36.0–46.0)
Hemoglobin: 10 g/dL — ABNORMAL LOW (ref 12.0–15.0)
MCH: 22.8 pg — ABNORMAL LOW (ref 26.0–34.0)
MCHC: 30.3 g/dL (ref 30.0–36.0)
MCV: 75.2 fL — ABNORMAL LOW (ref 78.0–100.0)
Platelets: 259 10*3/uL (ref 150–400)
RBC: 4.39 MIL/uL (ref 3.87–5.11)
RDW: 17.4 % — ABNORMAL HIGH (ref 11.5–15.5)
WBC: 8.3 10*3/uL (ref 4.0–10.5)

## 2011-04-15 LAB — SURGICAL PCR SCREEN
MRSA, PCR: INVALID — AB
Staphylococcus aureus: INVALID — AB

## 2011-04-15 NOTE — Patient Instructions (Addendum)
   Your procedure is scheduled on: Monday, Feb 25th  Enter through the Main Entrance of [facility] at:6am Pick up the phone at the desk and dial [phone] and inform us of your arrival.  Please call this number if you have any problems the morning of surgery: [phone]  Remember: Do not eat food after midnight: Sunday Do not drink clear liquids after: Sunday Take these medicines the morning of surgery with a SIP OF WATER: none  Do not wear jewelry, make-up, or FINGER nail polish Do not wear lotions, powders, perfumes or deodorant. Do not shave 48 hours prior to surgery. Do not bring valuables to the hospital.  Leave suitcase in the car. After Surgery it may be brought to your room. For patients being admitted to the hospital, checkout time is 11:00am the day of discharge. Home with Husband Sharma Covert  cell [phone] or Mother Serafina Royals cell [phone]  Patients discharged on the day of surgery will not be allowed to drive home.     Remember to use your hibiclens as instructed.Please shower with 1/2 bottle the evening before your surgery and the other 1/2 bottle the morning of surgery.

## 2011-04-15 NOTE — Pre-Procedure Instructions (Signed)
OK to see patient DOS 

## 2011-04-18 LAB — MRSA CULTURE

## 2011-04-22 ENCOUNTER — Encounter (HOSPITAL_COMMUNITY): Payer: Self-pay | Admitting: Obstetrics & Gynecology

## 2011-04-22 ENCOUNTER — Ambulatory Visit (HOSPITAL_COMMUNITY)
Admission: RE | Admit: 2011-04-22 | Discharge: 2011-04-22 | Disposition: A | Discharge status: Home / Self Care | Payer: Self-pay | Source: Ambulatory Visit | Attending: Obstetrics & Gynecology | Admitting: Obstetrics & Gynecology

## 2011-04-22 ENCOUNTER — Encounter (HOSPITAL_COMMUNITY): Payer: Self-pay | Admitting: Anesthesiology

## 2011-04-22 ENCOUNTER — Encounter (HOSPITAL_COMMUNITY): Payer: Self-pay | Admitting: *Deleted

## 2011-04-22 ENCOUNTER — Encounter (HOSPITAL_COMMUNITY)
Admission: RE | Disposition: A | Discharge status: Home / Self Care | Payer: Self-pay | Source: Ambulatory Visit | Attending: Obstetrics & Gynecology

## 2011-04-22 ENCOUNTER — Ambulatory Visit (HOSPITAL_COMMUNITY): Payer: Self-pay | Admitting: Anesthesiology

## 2011-04-22 DIAGNOSIS — Z01812 Encounter for preprocedural laboratory examination: Secondary | ICD-10-CM | POA: Insufficient documentation

## 2011-04-22 DIAGNOSIS — Z01818 Encounter for other preprocedural examination: Secondary | ICD-10-CM | POA: Insufficient documentation

## 2011-04-22 DIAGNOSIS — N925 Other specified irregular menstruation: Secondary | ICD-10-CM

## 2011-04-22 DIAGNOSIS — N938 Other specified abnormal uterine and vaginal bleeding: Secondary | ICD-10-CM | POA: Insufficient documentation

## 2011-04-22 DIAGNOSIS — D500 Iron deficiency anemia secondary to blood loss (chronic): Secondary | ICD-10-CM | POA: Insufficient documentation

## 2011-04-22 DIAGNOSIS — D251 Intramural leiomyoma of uterus: Secondary | ICD-10-CM | POA: Insufficient documentation

## 2011-04-22 DIAGNOSIS — N949 Unspecified condition associated with female genital organs and menstrual cycle: Secondary | ICD-10-CM

## 2011-04-22 DIAGNOSIS — N8000 Endometriosis of the uterus, unspecified: Secondary | ICD-10-CM | POA: Insufficient documentation

## 2011-04-22 HISTORY — PX: CYSTOSCOPY: SHX5120

## 2011-04-22 LAB — PREGNANCY, URINE: Preg Test, Ur: NEGATIVE

## 2011-04-22 SURGERY — ROBOTIC ASSISTED TOTAL HYSTERECTOMY
Anesthesia: General | Site: Bladder | Wound class: Clean Contaminated

## 2011-04-22 MED ORDER — FENTANYL CITRATE 0.05 MG/ML IJ SOLN
25.0000 ug | INTRAMUSCULAR | Status: DC | PRN
  Administered 2011-04-22 (×2): 50 ug via INTRAVENOUS

## 2011-04-22 MED ORDER — ONDANSETRON HCL 4 MG/2ML IJ SOLN
INTRAMUSCULAR | Status: DC | PRN
  Administered 2011-04-22: 4 mg via INTRAVENOUS

## 2011-04-22 MED ORDER — INDIGOTINDISULFONATE SODIUM 8 MG/ML IJ SOLN
INTRAMUSCULAR | Status: DC | PRN
  Administered 2011-04-22: 3 mL via INTRAVENOUS

## 2011-04-22 MED ORDER — PROPOFOL 10 MG/ML IV EMUL
INTRAVENOUS | Status: DC | PRN
  Administered 2011-04-22: 150 mg via INTRAVENOUS

## 2011-04-22 MED ORDER — NEOSTIGMINE METHYLSULFATE 1 MG/ML IJ SOLN
INTRAMUSCULAR | Status: DC | PRN
  Administered 2011-04-22: 5 mg via INTRAVENOUS

## 2011-04-22 MED ORDER — DEXTROSE IN LACTATED RINGERS 5 % IV SOLN: INTRAVENOUS | Status: DC

## 2011-04-22 MED ORDER — LACTATED RINGERS IV SOLN
INTRAVENOUS | Status: DC
  Administered 2011-04-22 (×2): 125 mL/h via INTRAVENOUS

## 2011-04-22 MED ORDER — ONDANSETRON HCL 4 MG/2ML IJ SOLN: 4.0000 mg | Freq: Once | INTRAMUSCULAR | Status: DC | PRN

## 2011-04-22 MED ORDER — OXYCODONE-ACETAMINOPHEN 7.5-325 MG PO TABS: 1.0000 | ORAL_TABLET | ORAL | Status: AC | PRN

## 2011-04-22 MED ORDER — LACTATED RINGERS IV SOLN
INTRAVENOUS | Status: DC | PRN
  Administered 2011-04-22 (×2): via INTRAVENOUS

## 2011-04-22 MED ORDER — MIDAZOLAM HCL 2 MG/2ML IJ SOLN
INTRAMUSCULAR | Status: AC
  Filled 2011-04-22: qty 2

## 2011-04-22 MED ORDER — ROPIVACAINE HCL 5 MG/ML IJ SOLN
INTRAMUSCULAR | Status: AC
  Filled 2011-04-22: qty 60

## 2011-04-22 MED ORDER — ACETAMINOPHEN 10 MG/ML IV SOLN
INTRAVENOUS | Status: DC | PRN
  Administered 2011-04-22: 1000 mg via INTRAVENOUS

## 2011-04-22 MED ORDER — DEXAMETHASONE SODIUM PHOSPHATE 10 MG/ML IJ SOLN
INTRAMUSCULAR | Status: AC
  Filled 2011-04-22: qty 1

## 2011-04-22 MED ORDER — LIDOCAINE HCL (CARDIAC) 20 MG/ML IV SOLN
INTRAVENOUS | Status: AC
  Filled 2011-04-22: qty 5

## 2011-04-22 MED ORDER — SODIUM CHLORIDE 0.9 % IJ SOLN
INTRAMUSCULAR | Status: DC | PRN
  Administered 2011-04-22: 60 mL via INTRAVENOUS

## 2011-04-22 MED ORDER — MEPERIDINE HCL 25 MG/ML IJ SOLN: 6.2500 mg | INTRAMUSCULAR | Status: DC | PRN

## 2011-04-22 MED ORDER — ACETAMINOPHEN 10 MG/ML IV SOLN
1000.0000 mg | Freq: Four times a day (QID) | INTRAVENOUS | Status: DC
  Filled 2011-04-22 (×4): qty 100

## 2011-04-22 MED ORDER — FENTANYL CITRATE 0.05 MG/ML IJ SOLN
INTRAMUSCULAR | Status: AC
  Filled 2011-04-22: qty 5

## 2011-04-22 MED ORDER — KETOROLAC TROMETHAMINE 30 MG/ML IJ SOLN: 15.0000 mg | Freq: Once | INTRAMUSCULAR | Status: DC | PRN

## 2011-04-22 MED ORDER — DEXAMETHASONE SODIUM PHOSPHATE 10 MG/ML IJ SOLN
INTRAMUSCULAR | Status: DC | PRN
  Administered 2011-04-22: 10 mg via INTRAVENOUS

## 2011-04-22 MED ORDER — FENTANYL CITRATE 0.05 MG/ML IJ SOLN
INTRAMUSCULAR | Status: AC
  Administered 2011-04-22: 50 ug via INTRAVENOUS
  Filled 2011-04-22: qty 2

## 2011-04-22 MED ORDER — STERILE WATER FOR IRRIGATION IR SOLN
Status: DC | PRN
  Administered 2011-04-22: 1000 mL

## 2011-04-22 MED ORDER — CEFAZOLIN SODIUM 1-5 GM-% IV SOLN
1.0000 g | INTRAVENOUS | Status: AC
  Administered 2011-04-22: 1 g via INTRAVENOUS

## 2011-04-22 MED ORDER — ARTIFICIAL TEARS OP OINT
TOPICAL_OINTMENT | OPHTHALMIC | Status: DC | PRN
  Administered 2011-04-22: 1 via OPHTHALMIC

## 2011-04-22 MED ORDER — ONDANSETRON HCL 4 MG/2ML IJ SOLN
INTRAMUSCULAR | Status: AC
  Filled 2011-04-22: qty 2

## 2011-04-22 MED ORDER — FENTANYL CITRATE 0.05 MG/ML IJ SOLN
INTRAMUSCULAR | Status: DC | PRN
  Administered 2011-04-22 (×4): 50 ug via INTRAVENOUS

## 2011-04-22 MED ORDER — INDIGOTINDISULFONATE SODIUM 8 MG/ML IJ SOLN
INTRAMUSCULAR | Status: AC
  Filled 2011-04-22: qty 10

## 2011-04-22 MED ORDER — NEOSTIGMINE METHYLSULFATE 1 MG/ML IJ SOLN
INTRAMUSCULAR | Status: AC
  Filled 2011-04-22: qty 10

## 2011-04-22 MED ORDER — ROPIVACAINE HCL 5 MG/ML IJ SOLN
INTRAMUSCULAR | Status: DC | PRN
  Administered 2011-04-22: 30 mL via EPIDURAL

## 2011-04-22 MED ORDER — LIDOCAINE HCL (CARDIAC) 20 MG/ML IV SOLN
INTRAVENOUS | Status: DC | PRN
  Administered 2011-04-22: 80 mg via INTRAVENOUS

## 2011-04-22 MED ORDER — PRENATAL MULTIVITAMIN CH
1.0000 | ORAL_TABLET | Freq: Every day | ORAL | Status: DC
  Filled 2011-04-22 (×2): qty 1

## 2011-04-22 MED ORDER — LACTATED RINGERS IR SOLN
Status: DC | PRN
  Administered 2011-04-22: 3000 mL

## 2011-04-22 MED ORDER — SCOPOLAMINE 1 MG/3DAYS TD PT72
1.0000 | MEDICATED_PATCH | TRANSDERMAL | Status: DC
  Administered 2011-04-22: 1.5 mg via TRANSDERMAL

## 2011-04-22 MED ORDER — CEFAZOLIN SODIUM 1-5 GM-% IV SOLN
INTRAVENOUS | Status: AC
  Filled 2011-04-22: qty 50

## 2011-04-22 MED ORDER — GLYCOPYRROLATE 0.2 MG/ML IJ SOLN
INTRAMUSCULAR | Status: AC
  Filled 2011-04-22: qty 1

## 2011-04-22 MED ORDER — ROCURONIUM BROMIDE 50 MG/5ML IV SOLN
INTRAVENOUS | Status: AC
  Filled 2011-04-22: qty 2

## 2011-04-22 MED ORDER — SCOPOLAMINE 1 MG/3DAYS TD PT72
MEDICATED_PATCH | TRANSDERMAL | Status: AC
  Administered 2011-04-22: 1.5 mg via TRANSDERMAL
  Filled 2011-04-22: qty 1

## 2011-04-22 MED ORDER — MIDAZOLAM HCL 5 MG/5ML IJ SOLN
INTRAMUSCULAR | Status: DC | PRN
  Administered 2011-04-22: 2 mg via INTRAVENOUS

## 2011-04-22 MED ORDER — ROCURONIUM BROMIDE 100 MG/10ML IV SOLN
INTRAVENOUS | Status: DC | PRN
  Administered 2011-04-22 (×2): 10 mg via INTRAVENOUS
  Administered 2011-04-22: 45 mg via INTRAVENOUS
  Administered 2011-04-22: 5 mg via INTRAVENOUS

## 2011-04-22 MED ORDER — OXYCODONE-ACETAMINOPHEN 5-325 MG PO TABS: 2.0000 | ORAL_TABLET | ORAL | Status: DC | PRN

## 2011-04-22 MED ORDER — OXYCODONE-ACETAMINOPHEN 5-325 MG PO TABS
1.0000 | ORAL_TABLET | ORAL | Status: DC | PRN
  Administered 2011-04-22: 2 via ORAL
  Administered 2011-04-22: 1 via ORAL
  Filled 2011-04-22 (×2): qty 2

## 2011-04-22 MED ORDER — PROPOFOL 10 MG/ML IV EMUL
INTRAVENOUS | Status: AC
  Filled 2011-04-22: qty 20

## 2011-04-22 MED ORDER — GLYCOPYRROLATE 0.2 MG/ML IJ SOLN
INTRAMUSCULAR | Status: DC | PRN
  Administered 2011-04-22: 0.1 mg via INTRAVENOUS
  Administered 2011-04-22: 1 mg via INTRAVENOUS

## 2011-04-22 MED ORDER — INDIGOTINDISULFONATE SODIUM 8 MG/ML IJ SOLN
INTRAMUSCULAR | Status: AC
  Filled 2011-04-22: qty 5

## 2011-04-22 MED ORDER — IBUPROFEN 600 MG PO TABS: 600.0000 mg | ORAL_TABLET | Freq: Four times a day (QID) | ORAL | Status: AC | PRN

## 2011-04-22 SURGICAL SUPPLY — 68 items
BAG URINE DRAINAGE (UROLOGICAL SUPPLIES) ×4 IMPLANT
BARRIER ADHS 3X4 INTERCEED (GAUZE/BANDAGES/DRESSINGS) IMPLANT
BENZOIN TINCTURE PRP APPL 2/3 (GAUZE/BANDAGES/DRESSINGS) ×4 IMPLANT
BLADE LAPAROSCOPIC MORCELL KIT (BLADE) IMPLANT
CABLE HIGH FREQUENCY MONO STRZ (ELECTRODE) ×4 IMPLANT
CATH FOLEY 3WAY  5CC 16FR (CATHETERS) ×1
CATH FOLEY 3WAY 5CC 16FR (CATHETERS) ×3 IMPLANT
CHLORAPREP W/TINT 26ML (MISCELLANEOUS) ×4 IMPLANT
CLOTH BEACON ORANGE TIMEOUT ST (SAFETY) ×4 IMPLANT
CONT PATH 16OZ SNAP LID 3702 (MISCELLANEOUS) ×4 IMPLANT
COVER MAYO STAND STRL (DRAPES) ×4 IMPLANT
COVER TABLE BACK 60X90 (DRAPES) ×8 IMPLANT
COVER TIP SHEARS 8 DVNC (MISCELLANEOUS) ×3 IMPLANT
COVER TIP SHEARS 8MM DA VINCI (MISCELLANEOUS) ×1
DECANTER SPIKE VIAL GLASS SM (MISCELLANEOUS) ×4 IMPLANT
DERMABOND ADVANCED (GAUZE/BANDAGES/DRESSINGS) ×1
DERMABOND ADVANCED .7 DNX12 (GAUZE/BANDAGES/DRESSINGS) ×3 IMPLANT
DRAPE HUG U DISPOSABLE (DRAPE) ×4 IMPLANT
DRAPE LG THREE QUARTER DISP (DRAPES) ×8 IMPLANT
DRAPE MONITOR DA VINCI (DRAPE) IMPLANT
DRAPE WARM FLUID 44X44 (DRAPE) ×4 IMPLANT
ELECT REM PT RETURN 9FT ADLT (ELECTROSURGICAL) ×4
ELECTRODE REM PT RTRN 9FT ADLT (ELECTROSURGICAL) ×3 IMPLANT
EVACUATOR SMOKE 8.L (FILTER) ×4 IMPLANT
GAUZE VASELINE 3X9 (GAUZE/BANDAGES/DRESSINGS) IMPLANT
GLOVE BIO SURGEON STRL SZ 6.5 (GLOVE) IMPLANT
GLOVE BIOGEL PI IND STRL 7.0 (GLOVE) ×12 IMPLANT
GLOVE BIOGEL PI INDICATOR 7.0 (GLOVE) ×4
GLOVE ECLIPSE 6.5 STRL STRAW (GLOVE) ×12 IMPLANT
GLOVE SKINSENSE NS SZ6.5 (GLOVE) ×6
GLOVE SKINSENSE STRL SZ6.5 (GLOVE) ×18 IMPLANT
GOWN STRL REIN XL XLG (GOWN DISPOSABLE) ×24 IMPLANT
KIT ACCESSORY DA VINCI DISP (KITS) ×1
KIT ACCESSORY DVNC DISP (KITS) ×3 IMPLANT
KIT DISP ACCESSORY 4 ARM (KITS) IMPLANT
MANIPULATOR UTERINE 4.5 ZUMI (MISCELLANEOUS) IMPLANT
NEEDLE INSUFFLATION 14GA 120MM (NEEDLE) ×4 IMPLANT
NEEDLE SPNL 18GX3.5 QUINCKE PK (NEEDLE) IMPLANT
NS IRRIG 1000ML POUR BTL (IV SOLUTION) ×12 IMPLANT
OCCLUDER COLPOPNEUMO (BALLOONS) ×4 IMPLANT
PACK LAVH (CUSTOM PROCEDURE TRAY) ×4 IMPLANT
PAD PREP 24X48 CUFFED NSTRL (MISCELLANEOUS) ×8 IMPLANT
PLUG CATH AND CAP STER (CATHETERS) ×4 IMPLANT
POSITIONER SURGICAL ARM (MISCELLANEOUS) ×8 IMPLANT
PROTECTOR NERVE ULNAR (MISCELLANEOUS) ×8 IMPLANT
SET CYSTO W/LG BORE CLAMP LF (SET/KITS/TRAYS/PACK) IMPLANT
SET IRRIG TUBING LAPAROSCOPIC (IRRIGATION / IRRIGATOR) ×4 IMPLANT
SOLUTION ELECTROLUBE (MISCELLANEOUS) ×4 IMPLANT
SPONGE LAP 18X18 X RAY DECT (DISPOSABLE) IMPLANT
STRIP CLOSURE SKIN 1/2X4 (GAUZE/BANDAGES/DRESSINGS) ×4 IMPLANT
SUT VIC AB 0 CT1 27 (SUTURE)
SUT VIC AB 0 CT1 27XBRD ANTBC (SUTURE) IMPLANT
SUT VIC AB 2-0 CT2 27 (SUTURE) ×8 IMPLANT
SUT VICRYL 0 UR6 27IN ABS (SUTURE) ×8 IMPLANT
SUT VICRYL RAPIDE 4/0 PS 2 (SUTURE) ×8 IMPLANT
SYR 50ML LL SCALE MARK (SYRINGE) ×4 IMPLANT
SYSTEM CONVERTIBLE TROCAR (TROCAR) IMPLANT
TIP UTERINE 5.1X6CM LAV DISP (MISCELLANEOUS) IMPLANT
TIP UTERINE 6.7X10CM GRN DISP (MISCELLANEOUS) IMPLANT
TIP UTERINE 6.7X6CM WHT DISP (MISCELLANEOUS) IMPLANT
TIP UTERINE 6.7X8CM BLUE DISP (MISCELLANEOUS) ×4 IMPLANT
TOWEL OR 17X24 6PK STRL BLUE (TOWEL DISPOSABLE) ×8 IMPLANT
TROCAR DISP BLADELESS 8 DVNC (TROCAR) ×3 IMPLANT
TROCAR DISP BLADELESS 8MM (TROCAR) ×1
TROCAR XCEL 12X100 BLDLESS (ENDOMECHANICALS) IMPLANT
TROCAR Z-THREAD 12X150 (TROCAR) ×4 IMPLANT
TROCAR Z-THREAD BLADED 12X100M (TROCAR) IMPLANT
TUBING FILTER THERMOFLATOR (ELECTROSURGICAL) ×4 IMPLANT

## 2011-04-22 NOTE — Discharge Instructions (Signed)
Hysterectomy A hysterectomy is a procedure where your womb (uterus) is surgically taken out. It will no longer be possible to have menstrual periods or to become pregnant. Removal of the tubes and ovaries (bilateral salpingo-oopherectomy) can be done during this operation as well.   An abdominal hysterectomy is done through a large cut (incision) in the abdomen made by the surgeon.   A vaginal hysterectomy is done through the vagina. There are no abdominal incisions, but there will be incisions on the inside of the vagina.   A laparoscopic assisted vaginal hysterectomy is done through 2 or 3 small incisions in the abdomen, but the uterus is removed and passed through the vagina.  Women who are going to have a hysterectomy should be tested first to make sure there is no cancer of the cervix or in the uterus. INDICATIONS FOR HYSTERECTOMY:  Persistent abnormal bleeding.   Lasting (chronic) pelvic pain.   Endometriosis. This is when the lining of the uterus (endometrium) is misplaced outside of its normal location.   Adenomyosis. This is when the endometrium tissue grows in the muscle of the uterus.   Uterine prolapse. This is when the uterus falls down into the vagina.   Cancer of the uterus or cervix that requires a radical hysterectomy, removal of the uterus, tubes, ovaries, and surrounding lymph nodes.  LET YOUR CAREGIVER KNOW ABOUT:  Allergies (especially to medicines).   Medications taken including herbs, eye drops, over the counter medications, and creams.   Use of steroids (by mouth or creams).   Past problems with anesthetics or numbing medication.   Possibility of pregnancy, if this applies.   History of blood clots (thrombophlebitis).   History of bleeding or blood problems.   Past surgery.   Other health problems.  RISKS AND COMPLICATIONS All surgeries can have risks. Some of these risks are:  A lot of bleeding.   Injury to surrounding organs.   Infection.    Blood clots of the leg, heart, or lung.   Problems with anesthesia.   Early menopause.  BEFORE THE PROCEDURE  Do not take aspirin or blood thinners for a week before surgery, or as directed by your caregiver.   Do not eat or drink anything after midnight the night before surgery, or as directed by your caregiver.   Let your caregiver know if you get a cold or other infectious problems before surgery.   If you are being admitted the day of surgery, you should be present 60 minutes before your procedure or as told by your caregiver.  PROCEDURE   An IV (intravenous) will be placed in your arm. You will be given a drug to make you sleep (anesthetic) during surgery. You may be given a shot in the spine (spinal anesthesia) that will numb your body from the waist down. This will keep you pain-free during surgery.   When you wake from surgery, you will have the IV and a long, narrow, hollow tube (urinary catheter) draining the bladder for 1 or 2 days after surgery. This will make passing your urine easier. It also helps by keeping your bladder empty during surgery.   After surgery, you will be taken to the recovery area where a nurse will watch and check your progress. Once you wake up, stable and taking fluids well, without other problems, you will be allowed to return to your room. Usually you will remain in the hospital 3 to 5 days. You may be given an antibiotic during and after   the surgery and when you go home. Pain medication will be ordered by your caregiver while you are in the hospital and when you go home.  HOME CARE INSTRUCTIONS  Healing will take time. You will have discomfort, tenderness, swelling, and bruising at the operative site for a couple of weeks. This is normal and will get better as time goes on.   Only take over-the-counter or prescription medicines for pain, discomfort, or fever as directed by your caregiver.   Do not take aspirin. It can cause bleeding.   Do not  drive when taking pain medication.   Follow your caregiver's advice regarding diet, exercise, lifting, driving, and general activities.   Resume your usual diet as directed and allowed.   Get plenty of rest and sleep.   Do not douche, use tampons, or have sexual intercourse until your caregiver gives you permission.   Change your bandages (dressings) as directed.   Take your temperature twice a day. Write it down.   Your caregiver may recommend showers instead of baths for a few weeks.   Do not drink alcohol until your caregiver gives you permission.   If you develop constipation, you may take a mild laxative with your caregiver's permission. Bran foods and drinking fluids helps with constipation problems.   Try to have someone home with you for a week or two to help with the household activities.   Make sure you and your family understands everything about your operation and recovery.   Do not sign any legal documents until you feel normal again.   Keep all your follow-up appointments as recommended by your caregiver.  SEEK MEDICAL CARE IF:   There is swelling, redness, or increasing pain in the wound area.   Pus is coming from the wound.   You notice a bad smell from the wound or surgical dressing.   You have pain, redness, and swelling from the intravenous site.   The wound is breaking open (the edges are not staying together).   You feel dizzy or feel like fainting.   You develop pain or bleeding when you urinate.   You develop diarrhea.   You develop nausea and vomiting.   You develop abnormal vaginal discharge.   You develop a rash.   You have any type of abnormal reaction or develop an allergy to your medication.   You need stronger pain medication for your pain.  SEEK IMMEDIATE MEDICAL CARE IF:  You have a fever.   You develop abdominal pain.   You develop chest pain.   You develop shortness of breath.   You pass out.   You develop pain,  swelling, or redness of your leg.   You develop heavy vaginal bleeding with or without blood clots.  Document Released: 08/07/2000 Document Revised: 10/24/2010 Document Reviewed: 06/25/2007 [employer] Patient Information 2012 [employer]. 

## 2011-04-22 NOTE — Addendum Note (Signed)
Addendum  created 04/22/11 1837 by Cephus Shelling, CRNA   Modules edited:Notes Section

## 2011-04-22 NOTE — H&P (Signed)
Erin Travis is an M W 46 y.o. female.   Pertinent Gynecological History: Menses: flow is excessive with use of 6 pads or tampons on heaviest days Bleeding: intermenstrual bleeding Contraception: BTL DES exposure: unknown Blood transfusions: none Sexually transmitted diseases: no past history Previous GYN Procedures: EMBX  Last mammogram: NA Date: 12/12 Last pap: normal Date:  OB History: G2, P2   Menstrual History: Menarche age: 18 Patient's last menstrual period was 04/17/2011.    Past Medical History  Diagnosis Date  . DUB (dysfunctional uterine bleeding)   . PONV (postoperative nausea and vomiting)   . Hemorrhoids   . Headache   . Seizures     caused by fever as child - no seizures since childhood, no meds  . Anxiety     no meds    Past Surgical History  Procedure Date  . Tubal ligation 07/2005  . Laparoscopy 11/2004  . Tonsillectomy and adenoidectomy   . Wisdom tooth extraction   . Multiple tooth extractions   . Svd     x 2    Family History  Problem Relation Age of Onset  . Cancer Father   . Lung disease Mother     Social History:  reports that she has never smoked. She has never used smokeless tobacco. She reports that she does not drink alcohol or use illicit drugs. She has worked at [employer] for 15 years, married for 4 years, occasional dysparunia  Allergies:  Allergies  Allergen Reactions  . Latex Itching    Vaginal itching and irritation  . Penicillins Itching    Prescriptions prior to admission  Medication Sig Dispense Refill  . aspirin-acetaminophen-caffeine (EXCEDRIN MIGRAINE) 250-250-65 MG per tablet Take 2 tablets by mouth daily as needed. Headache        ROS as above  Blood pressure 123/61, pulse 73, temperature 98.4 F (36.9 C), temperature source Oral, resp. rate 18, last menstrual period 04/17/2011, SpO2 100.00%. Physical Exam Heart- rrr Lungs- CTAB Abd- benign  Results for orders placed during the hospital encounter of  04/22/11 (from the past 24 hour(s))  PREGNANCY, URINE     Status: Normal   Collection Time   04/22/11  5:55 AM      Component Value Range   Preg Test, Ur NEGATIVE  NEGATIVE     No results found.  Assessment/Plan: DUB with anemia- RATH/BL salpingectomy/cystoscopy  Erin Travis C. 04/22/2011, 7:07 AM

## 2011-04-22 NOTE — Anesthesia Postprocedure Evaluation (Signed)
Anesthesia Post Note  Patient: Erin Travis  Procedure(s) Performed: Procedure(s) (LRB): ROBOTIC ASSISTED TOTAL HYSTERECTOMY (N/A) CYSTOSCOPY (N/A) BILATERAL SALPINGECTOMY (Bilateral)  Anesthesia type: General  Patient location: PACU  Post pain: Pain level controlled  Post assessment: Post-op Vital signs reviewed  Last Vitals:  Filed Vitals:   04/22/11 1110  BP:   Pulse: 69  Temp:   Resp: 16    Post vital signs: Reviewed  Level of consciousness: sedated  Complications: No apparent anesthesia complications

## 2011-04-22 NOTE — Transfer of Care (Signed)
Immediate Anesthesia Transfer of Care Note  Patient: Erin Travis  Procedure(s) Performed: Procedure(s) (LRB): ROBOTIC ASSISTED TOTAL HYSTERECTOMY (N/A) CYSTOSCOPY (N/A) BILATERAL SALPINGECTOMY (Bilateral)  Patient Location: PACU  Anesthesia Type: General  Level of Consciousness: awake, alert  and oriented  Airway & Oxygen Therapy: Patient Spontanous Breathing and Patient connected to nasal cannula oxygen  Post-op Assessment: Report given to PACU RN and Post -op Vital signs reviewed and stable  Post vital signs: Reviewed and stable  Complications: No apparent anesthesia complications

## 2011-04-22 NOTE — Op Note (Signed)
04/22/2011  9:58 AM  PATIENT:  Erin Travis  46 y.o. female  PRE-OPERATIVE DIAGNOSIS:  Dysfunctional Uterine Bleeding; Anemia  POST-OPERATIVE DIAGNOSIS:  Dysfunctional Uterine Bleeding; Anemia  PROCEDURE:  Procedure(s) (LRB): ROBOTIC ASSISTED TOTAL HYSTERECTOMY (N/A) CYSTOSCOPY (N/A) BILATERAL SALPINGECTOMY (Bilateral)  SURGEON:  Surgeon(s) and Role:    * Rowdy Guerrini C. Marice Potter, MD - Primary      PHYSICIAN ASSISTANT:   ASSISTANTS: Scheryl Darter, MD   ANESTHESIA:   general  EBL:  Total I/O In: 1000 [I.V.:1000] Out: 250 [Urine:100; Blood:150]  BLOOD ADMINISTERED:none  DRAINS: none   LOCAL MEDICATIONS USED:  OTHER Ropivicaine 60 cc  SPECIMEN:  Source of Specimen:  uterus, tubes  DISPOSITION OF SPECIMEN:  PATHOLOGY  COUNTS:  YES  TOURNIQUET:  * No tourniquets in log *  DICTATION: .Dragon Dictation  PLAN OF CARE: Discharge to home after PACU  PATIENT DISPOSITION:  PACU - hemodynamically stable.   Delay start of Pharmacological VTE agent (>24hrs) due to surgical blood loss or risk of bleeding: not applicable The risks, benefits, and alternatives of surgery were explained prior to surgery. Consents were signed. All questions were answered. She was very adamant that she did not 1 her ovaries removed unless I saw cancer. We discussed the risk of cuff dehiscence (1%). She was taken to the operating room and placed in the dorsal lithotomy position. She was carefully tucked and general anesthesia was applied without complication. Her abdomen and vagina were prepped and draped in the usual sterile fashion. A bimanual exam revealed a 10 week size mobile uterus and nonenlarged adnexa. A weighted speculum was placed posteriorly. The cervix was grasped with a single-tooth tenaculum. A paracervical block was done with 30 mL of 0.5% ropivacaine. Her first uterus sounded to 11 cm. I dilated her cervix to accommodate the Rumi retractor. A Rumi retractor was placed without difficulty and sewn into  the cervix. A Foley catheter was placed, and it drained clear urine throughout the case. Gloves were changed, and attention was turned to the abdomen. A vertical supraumbilical incision was made after injecting 10 mL of dilute ropivacaine. A varies needle was placed intraperitoneally. Low-flow CO2 was used to insufflate the abdomen to 5 L. A 10 mm trocar was placed. Laparoscopy confirmed correct placement. Dilute ropivacaine was injected in all incision sites prior to making incisions. We placed an 8 mm port in the left lower quadrant, and an 8 mm port approximately 10 mm lateral to the side of the umbilical port. The robot was docked. I then moved to the console and inspected the uterus. There were and powder burn lesions of endometriosis on her posterior cervix. The remainder of the pelvis did not have any other evidence of endometriosis. I used the PK to cauterize the utero-ovarian ligaments, round ligaments bilaterally. I then created a bladder flap anteriorly. I then cauterized the uterine vessels bilaterally. I made an anterior colpotomy and carried the colpotomy incision around the cervix, following the blue Rumi manipulator. The uterus was then removed through the vagina. Hemostasis was noted throughout. I then spoke with her husband about the endometriosis the possibility of removing her ovaries. After careful consideration he agreed that she did not want her ovaries removed, even she would mean that she would have endometriosis still present. I agreed with him and told him that should she want her ovaries removed laparoscopically in the future I would be happy to help her. This point return to the operating room and removed her oviducts with the PK.  All pedicles were hemostatic. I closed the vaginal cuff with 4-0 Vicryl interrupted sutures. We then undocked the robot and I proceeded with a cystoscopy. Blue dye was seen from both ureters. I then changed gloves/grown and we used an Endoloop closure to close  the site at the assistant port. The fascia at the supraumbilical incision was closed with a 0 Vicryl figure-of-eight suture. The subcutaneous sites were closed with 4-0 Vicryl sutures. Steri-Strips were placed. She was taken to recovery room in stable condition.

## 2011-04-22 NOTE — Anesthesia Preprocedure Evaluation (Signed)
Anesthesia Evaluation  Patient identified by MRN, date of birth, ID band Patient awake    Reviewed: Allergy & Precautions, H&P , Patient's Chart, lab work & pertinent test results  Airway Mallampati: I TM Distance: >3 FB Neck ROM: full    Dental No notable dental hx.    Pulmonary neg pulmonary ROS,    Pulmonary exam normal       Cardiovascular neg cardio ROS     Neuro/Psych Anxiety    GI/Hepatic negative GI ROS, Neg liver ROS,   Endo/Other  Negative Endocrine ROS  Renal/GU negative Renal ROS  Genitourinary negative   Musculoskeletal negative musculoskeletal ROS (+)   Abdominal Normal abdominal exam  (+)   Peds negative pediatric ROS (+)  Hematology negative hematology ROS (+)   Anesthesia Other Findings   Reproductive/Obstetrics negative OB ROS                           Anesthesia Physical Anesthesia Plan  ASA: II  Anesthesia Plan: General   Post-op Pain Management:    Induction: Intravenous  Airway Management Planned: Oral ETT  Additional Equipment:   Intra-op Plan:   Post-operative Plan: Extubation in OR  Informed Consent: I have reviewed the patients History and Physical, chart, labs and discussed the procedure including the risks, benefits and alternatives for the proposed anesthesia with the patient or authorized representative who has indicated his/her understanding and acceptance.     Plan Discussed with: CRNA and Surgeon  Anesthesia Plan Comments:         Anesthesia Quick Evaluation

## 2011-04-22 NOTE — Anesthesia Postprocedure Evaluation (Signed)
  Anesthesia Post-op Note  Patient: Erin Travis  Procedure(s) Performed: Procedure(s) (LRB): ROBOTIC ASSISTED TOTAL HYSTERECTOMY (N/A) CYSTOSCOPY (N/A) BILATERAL SALPINGECTOMY (Bilateral)  Patient Location: PACU and Women's Unit  Anesthesia Type: General  Level of Consciousness: awake  Airway and Oxygen Therapy: Patient Spontanous Breathing  Post-op Pain: mild  Post-op Assessment: Post-op Vital signs reviewed  Post-op Vital Signs: Reviewed and stable  Complications: No apparent anesthesia complications

## 2011-04-22 NOTE — Anesthesia Procedure Notes (Signed)
Procedure Name: Intubation Date/Time: 04/22/2011 7:28 AM Performed by: Karleen Dolphin Pre-anesthesia Checklist: Suction available, Emergency Drugs available, Patient identified and Patient being monitored Patient Re-evaluated:Patient Re-evaluated prior to inductionOxygen Delivery Method: Circle system utilized Preoxygenation: Pre-oxygenation with 100% oxygen Intubation Type: IV induction Ventilation: Mask ventilation without difficulty Laryngoscope Size: Mac and 3 Grade View: Grade I Tube type: Oral Number of attempts: 1 Airway Equipment and Method: Stylet Placement Confirmation: ETT inserted through vocal cords under direct vision,  breath sounds checked- equal and bilateral and positive ETCO2 Secured at: 20 cm Tube secured with: Tape Dental Injury: Teeth and Oropharynx as per pre-operative assessment

## 2011-04-24 ENCOUNTER — Encounter (HOSPITAL_COMMUNITY): Payer: Self-pay | Admitting: Obstetrics & Gynecology

## 2011-05-08 ENCOUNTER — Encounter: Payer: Self-pay | Admitting: Obstetrics & Gynecology

## 2011-05-08 ENCOUNTER — Ambulatory Visit (INDEPENDENT_AMBULATORY_CARE_PROVIDER_SITE_OTHER): Payer: Self-pay | Admitting: Obstetrics & Gynecology

## 2011-05-08 VITALS — BP 127/66 | HR 87 | Temp 98.2°F | Ht 67.5 in | Wt 203.4 lb

## 2011-05-08 DIAGNOSIS — Z09 Encounter for follow-up examination after completed treatment for conditions other than malignant neoplasm: Secondary | ICD-10-CM

## 2011-05-08 NOTE — Progress Notes (Signed)
  Subjective:    Patient ID: Erin Travis, female    DOB: 1965-07-03, 46 y.o.   MRN: [id number]  HPI Mrs. Francis Dowse is now 2 1/2 weeks post op status  RATH/bilateral salpingectomy/cystoscopy. Endometriosis was seen at the time of surgery, but she had declined removal of her ovaries preoperatively and her husband supported that decision intraoperatively. She has no complaints and wants to return to work at [employer] now. She has not had sex yet and agrees to wait until 8 weeks post op.   Review of Systems     Objective:   Physical Exam  Incisions all healed great Vaginal cuff healing well      Assessment & Plan:  Post op doing well RTC 5 weeks She may return to work.

## 2011-05-15 ENCOUNTER — Telehealth: Payer: Self-pay | Admitting: *Deleted

## 2011-05-15 NOTE — Telephone Encounter (Signed)
Pt left a message stating that she is having a lot of pain in her ovaries. She also thinks that she was running a fever last night. She wanted to speak to someone to see what she should do.

## 2011-05-15 NOTE — Telephone Encounter (Signed)
Called patient- c/o had robotic hysterectomy 04/22/11, had post op visit 05/08/11- now c/o feels like period starting, breasts tender, ovary tender . States feels like the pain she had originally after surgery. Taking ibuprofen and discharge pain med with relief. Also c/o incision is now red with pus drainage and maybe fever. Gave patient appointment to be seen in am at 1030 with instructions to go to [facility] if pain severe or fever. Also discussed will still ovulate.

## 2011-05-16 ENCOUNTER — Ambulatory Visit (INDEPENDENT_AMBULATORY_CARE_PROVIDER_SITE_OTHER): Payer: Self-pay | Admitting: Obstetrics and Gynecology

## 2011-05-16 ENCOUNTER — Telehealth: Payer: Self-pay | Admitting: Obstetrics and Gynecology

## 2011-05-16 ENCOUNTER — Encounter: Payer: Self-pay | Admitting: Obstetrics and Gynecology

## 2011-05-16 VITALS — BP 114/71 | HR 79 | Temp 99.1°F | Ht 67.5 in | Wt 205.5 lb

## 2011-05-16 DIAGNOSIS — N760 Acute vaginitis: Secondary | ICD-10-CM

## 2011-05-16 DIAGNOSIS — Z09 Encounter for follow-up examination after completed treatment for conditions other than malignant neoplasm: Secondary | ICD-10-CM

## 2011-05-16 LAB — WET PREP, GENITAL
Trich, Wet Prep: NONE SEEN
Yeast Wet Prep HPF POC: NONE SEEN

## 2011-05-16 MED ORDER — CEPHALEXIN 500 MG PO CAPS: 500.0000 mg | ORAL_CAPSULE | Freq: Four times a day (QID) | ORAL | Status: DC

## 2011-05-16 NOTE — Progress Notes (Signed)
Pt states is also having vaginal odor.

## 2011-05-16 NOTE — Progress Notes (Deleted)
Patient called stating that keflex was not dispensed in [facility]. Verified with [facility] that there is no Rx sent despite what the chart states (it was electronically sent). Verbally gave the order to the pharmacist as it shows in the Medication that was sent by Catalina Antigua, MD today - { Keflex 500mg  QID PO #28 no refill}

## 2011-05-16 NOTE — Progress Notes (Signed)
Patient called stating that keflex was not sent to [facility] as advised earlier. Called [facility] and confirmed that the Rx did not go through. Varbally gave the order of Keflex 500mg  QID PO #28 as written on chart by Catalina Antigua, MD to the pharmacist. Pt. Satisfied.

## 2011-05-16 NOTE — Progress Notes (Signed)
Patient presents today for evaluation of vaginal discharge with odor. Patient had a robotic assisted hysterectomy with bilateral salpingectomy on 2/25 and had an uncomplicated post-operative course. Patient also reports that one of her incision is draining pus-like fluid. Patient denies any fever.  GENERAL: Well-developed, well-nourished female in no acute distress.  ABDOMEN: Soft, nontender, nondistended. No organomegaly.  Incisions: all incision healing well with the exception of RLQ incision which has a small area of erythema surrounding it, with induration and purulent discharge can be expressed by dropplets PELVIC: Normal external female genitalia. Vagina is pink and rugated.  Pink tinged thin discharge. Vaginal cuff is intact. No adnexal mass or tenderness. EXTREMITIES: No cyanosis, clubbing, or edema, 2+ distal pulses.  A/P 46 yo G2P2 s/p robotic hysterectomy on 2/25 with wound incision cellulitis and vaginal discharge - Will treat cellulitis with antibiotics - wet prep collected - rtc prn

## 2011-05-17 MED ORDER — METRONIDAZOLE 500 MG PO TABS: 500.0000 mg | ORAL_TABLET | Freq: Two times a day (BID) | ORAL | Status: DC

## 2011-05-17 NOTE — Progress Notes (Signed)
Addended by: Catalina Antigua on: 05/17/2011 03:30 PM   Modules accepted: Orders

## 2011-05-20 ENCOUNTER — Telehealth: Payer: Self-pay | Admitting: *Deleted

## 2011-05-20 NOTE — Telephone Encounter (Signed)
Message copied by Lynnell Dike on Mon May 20, 2011  4:03 PM ------      Message from: Catalina Antigua      Created: Fri May 17, 2011  3:27 PM       Please inform patient that a prescription for Flagyl has been e-prescribed for the treatment of BV            Peggy

## 2011-05-20 NOTE — Telephone Encounter (Signed)
Telephoned pt and left message to return call to the clinic.

## 2011-05-21 NOTE — Telephone Encounter (Signed)
Pt left message @ [phone] today and stated that she missed our call yesterday. She also stated that the medicine she was given last week made her sick and the medicine she picked up yesterday for the other infection has given her a H/A and makes her feel sick to her stomach. Please call back.

## 2011-05-22 NOTE — Telephone Encounter (Signed)
Called pt to discuss her medication concerns. She stated that she started the first medication (Keflex) and after 4 doses felt as though she could not breathe and stopped the medication. Next, she started the Flagyl and had a severe upset stomach after the first dose. She has stopped that medication also and stated that she will not take the rest. I told pt that I will talk with the doctor tomorrow and then call her back. Pt voiced understanding.

## 2011-05-23 MED ORDER — SULFAMETHOXAZOLE-TRIMETHOPRIM 800-160 MG PO TABS: 1.0000 | ORAL_TABLET | Freq: Two times a day (BID) | ORAL | Status: AC

## 2011-05-23 NOTE — Telephone Encounter (Signed)
Called and spoke w/pt after consult with Dr. Shawnie Pons.  I informed her that we are going to send an alternate antibiotic Rx for her wound cellulitis. We are not going to prescribe anything else for the BV because her body should take care of that problem. Pt voiced understanding and was in agreement with plan of care.

## 2011-06-06 NOTE — Telephone Encounter (Signed)
Resolved. Opened in error

## 2011-06-14 ENCOUNTER — Ambulatory Visit (INDEPENDENT_AMBULATORY_CARE_PROVIDER_SITE_OTHER): Payer: Self-pay | Admitting: Obstetrics & Gynecology

## 2011-06-14 ENCOUNTER — Encounter: Payer: Self-pay | Admitting: Obstetrics & Gynecology

## 2011-06-14 VITALS — BP 107/67 | HR 62 | Temp 98.9°F | Ht 67.5 in | Wt 200.5 lb

## 2011-06-14 DIAGNOSIS — Z09 Encounter for follow-up examination after completed treatment for conditions other than malignant neoplasm: Secondary | ICD-10-CM

## 2011-06-14 DIAGNOSIS — Z9889 Other specified postprocedural states: Secondary | ICD-10-CM

## 2011-06-14 MED ORDER — CITALOPRAM HYDROBROMIDE 10 MG PO TABS: 10.0000 mg | ORAL_TABLET | Freq: Every day | ORAL | Status: DC

## 2011-06-14 NOTE — Progress Notes (Signed)
  Subjective:    Patient ID: Erin Travis, female    DOB: 07-26-1965, 46 y.o.   MRN: [id number]  HPI  She is now 7 weeks post op status post RATH. Her RLQ incision had a cellulitis which was cured with Keflex. She has no post op related complaints. She has not had sex yet and returned to work about 2 weeks post op. Her complaint today is that of situational depression. She denies SI and HI. Her husband has been acting very strangely, disappearing for 5 days at a time, driving while on medications he should not, etc.   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

## 2011-07-18 ENCOUNTER — Encounter: Payer: Self-pay | Admitting: Obstetrics & Gynecology

## 2011-07-18 ENCOUNTER — Ambulatory Visit (INDEPENDENT_AMBULATORY_CARE_PROVIDER_SITE_OTHER): Payer: Self-pay | Admitting: Obstetrics & Gynecology

## 2011-07-18 VITALS — BP 134/83 | HR 61 | Ht 67.5 in | Wt 205.9 lb

## 2011-07-18 DIAGNOSIS — F325 Major depressive disorder, single episode, in full remission: Secondary | ICD-10-CM

## 2011-07-18 NOTE — Progress Notes (Signed)
  Subjective:    Patient ID: Erin Travis, female    DOB: 1966/01/06, 46 y.o.   MRN: [id number]  HPI Mrs. Francis Dowse is here for follow up of her situational depression. She was having marital issues and I prescribed Celexa. She took 2 doses but said it made her feel too sleepy so she didn't take it any longer. Her marital issues have "eased up", and she does not feel like she needs medication for it.   Review of Systems     Objective:   Physical Exam        Assessment & Plan:  Depression resolved RTC 1 year for annual exam

## 2012-03-19 ENCOUNTER — Encounter: Payer: Self-pay | Admitting: Obstetrics & Gynecology

## 2012-03-19 ENCOUNTER — Ambulatory Visit (INDEPENDENT_AMBULATORY_CARE_PROVIDER_SITE_OTHER): Payer: BC Managed Care – PPO | Admitting: Obstetrics & Gynecology

## 2012-03-19 VITALS — BP 132/75 | HR 78 | Temp 98.5°F | Resp 16 | Ht 67.5 in | Wt 218.0 lb

## 2012-03-19 DIAGNOSIS — R635 Abnormal weight gain: Secondary | ICD-10-CM

## 2012-03-19 DIAGNOSIS — Z01419 Encounter for gynecological examination (general) (routine) without abnormal findings: Secondary | ICD-10-CM

## 2012-03-19 DIAGNOSIS — Z0000 Encounter for general adult medical examination without abnormal findings: Secondary | ICD-10-CM

## 2012-03-19 NOTE — Progress Notes (Signed)
Patient ID: Erin Travis, female   DOB: [birth year], 47 y.o.   MRN: [id number] Subjective:    Erin Travis is a 47 y.o. female who presents for an annual exam. She complains of a 20+ # weight gain since her RATH.  The patient is sexually active. GYN screening history: last pap: was normal. The patient wears seatbelts: yes. The patient participates in regular exercise: yes. Has the patient ever been transfused or tattooed?: no. The patient reports that there is not domestic violence in her life.   Menstrual History: OB History    Grav Para Term Preterm Abortions TAB SAB Ect Mult Living   2 2        2       Menarche age: 22 Patient's last menstrual period was 04/17/2011.    The following portions of the patient's history were reviewed and updated as appropriate: allergies, current medications, past family history, past medical history, past social history, past surgical history and problem list.  Review of Systems A comprehensive review of systems was negative. She works at [employer] for 16 years. She says her marital issues have improved and she is not taking the celexa. She declines a flu vaccine.   Objective:    BP 132/75  Pulse 78  Temp 98.5 F (36.9 C) (Oral)  Resp 16  Ht 5' 7.5" (1.715 m)  Wt 218 lb (98.884 kg)  BMI 33.64 kg/m2  LMP 04/17/2011  General Appearance:    Alert, cooperative, no distress, appears stated age  Head:    Normocephalic, without obvious abnormality, atraumatic  Eyes:    PERRL, conjunctiva/corneas clear, EOM's intact, fundi    benign, both eyes  Ears:    Normal TM's and external ear canals, both ears  Nose:   Nares normal, septum midline, mucosa normal, no drainage    or sinus tenderness  Throat:   Lips, mucosa, and tongue normal; teeth and gums normal  Neck:   Supple, symmetrical, trachea midline, no adenopathy;    thyroid:  no enlargement/tenderness/nodules; no carotid   bruit or JVD  Back:     Symmetric, no curvature, ROM normal, no CVA tenderness   Lungs:     Clear to auscultation bilaterally, respirations unlabored  Chest Wall:    No tenderness or deformity   Heart:    Regular rate and rhythm, S1 and S2 normal, no murmur, rub   or gallop  Breast Exam:    No tenderness, masses, or nipple abnormality  Abdomen:     Soft, non-tender, bowel sounds active all four quadrants,    no masses, no organomegaly  Genitalia:    Normal female without lesion, discharge or tenderness, normal bimanual exam     Extremities:   Extremities normal, atraumatic, no cyanosis or edema  Pulses:   2+ and symmetric all extremities  Skin:   Skin color, texture, turgor normal, no rashes or lesions  Lymph nodes:   Cervical, supraclavicular, and axillary nodes normal  Neurologic:   CNII-XII intact, normal strength, sensation and reflexes    throughout  .    Assessment:    Healthy female exam.  Weight gain  Plan:     Breast self exam technique reviewed and patient encouraged to perform self-exam monthly. Mammogram.  Check TSH

## 2012-03-20 ENCOUNTER — Telehealth: Payer: Self-pay | Admitting: *Deleted

## 2012-03-20 LAB — TSH: TSH: 1.012 u[IU]/mL (ref 0.350–4.500)

## 2012-03-20 NOTE — Telephone Encounter (Signed)
Pt notified of normal TSH levels and a copy mailed to pt.

## 2012-04-02 ENCOUNTER — Ambulatory Visit (HOSPITAL_COMMUNITY)
Admission: RE | Admit: 2012-04-02 | Discharge: 2012-04-02 | Disposition: A | Discharge status: Home / Self Care | Payer: BC Managed Care – PPO | Source: Ambulatory Visit | Attending: Obstetrics & Gynecology | Admitting: Obstetrics & Gynecology

## 2012-04-02 DIAGNOSIS — Z0000 Encounter for general adult medical examination without abnormal findings: Secondary | ICD-10-CM

## 2012-04-02 DIAGNOSIS — Z1231 Encounter for screening mammogram for malignant neoplasm of breast: Secondary | ICD-10-CM

## 2012-04-11 ENCOUNTER — Other Ambulatory Visit: Payer: Self-pay

## 2012-05-24 ENCOUNTER — Emergency Department (HOSPITAL_COMMUNITY): Payer: BC Managed Care – PPO

## 2012-05-24 ENCOUNTER — Emergency Department (HOSPITAL_COMMUNITY)
Admission: EM | Admit: 2012-05-24 | Discharge: 2012-05-24 | Disposition: A | Discharge status: Home / Self Care | Payer: BC Managed Care – PPO | Attending: Emergency Medicine | Admitting: Emergency Medicine

## 2012-05-24 ENCOUNTER — Encounter (HOSPITAL_COMMUNITY): Payer: Self-pay | Admitting: Emergency Medicine

## 2012-05-24 DIAGNOSIS — Z8669 Personal history of other diseases of the nervous system and sense organs: Secondary | ICD-10-CM | POA: Insufficient documentation

## 2012-05-24 DIAGNOSIS — E669 Obesity, unspecified: Secondary | ICD-10-CM | POA: Insufficient documentation

## 2012-05-24 DIAGNOSIS — Z8679 Personal history of other diseases of the circulatory system: Secondary | ICD-10-CM | POA: Insufficient documentation

## 2012-05-24 DIAGNOSIS — R209 Unspecified disturbances of skin sensation: Secondary | ICD-10-CM | POA: Insufficient documentation

## 2012-05-24 DIAGNOSIS — Z8719 Personal history of other diseases of the digestive system: Secondary | ICD-10-CM | POA: Insufficient documentation

## 2012-05-24 DIAGNOSIS — M4802 Spinal stenosis, cervical region: Secondary | ICD-10-CM

## 2012-05-24 DIAGNOSIS — M47812 Spondylosis without myelopathy or radiculopathy, cervical region: Secondary | ICD-10-CM | POA: Insufficient documentation

## 2012-05-24 DIAGNOSIS — Z8659 Personal history of other mental and behavioral disorders: Secondary | ICD-10-CM | POA: Insufficient documentation

## 2012-05-24 DIAGNOSIS — Z8742 Personal history of other diseases of the female genital tract: Secondary | ICD-10-CM | POA: Insufficient documentation

## 2012-05-24 DIAGNOSIS — R202 Paresthesia of skin: Secondary | ICD-10-CM

## 2012-05-24 LAB — CBC WITH DIFFERENTIAL/PLATELET
Basophils Absolute: 0 10*3/uL (ref 0.0–0.1)
Basophils Relative: 0 % (ref 0–1)
Eosinophils Absolute: 0.1 10*3/uL (ref 0.0–0.7)
Eosinophils Relative: 1 % (ref 0–5)
HCT: 36.9 % (ref 36.0–46.0)
Hemoglobin: 12.3 g/dL (ref 12.0–15.0)
Lymphocytes Relative: 28 % (ref 12–46)
Lymphs Abs: 2.6 10*3/uL (ref 0.7–4.0)
MCH: 29.3 pg (ref 26.0–34.0)
MCHC: 33.3 g/dL (ref 30.0–36.0)
MCV: 87.9 fL (ref 78.0–100.0)
Monocytes Absolute: 0.6 10*3/uL (ref 0.1–1.0)
Monocytes Relative: 7 % (ref 3–12)
Neutro Abs: 5.9 10*3/uL (ref 1.7–7.7)
Neutrophils Relative %: 64 % (ref 43–77)
Platelets: 218 10*3/uL (ref 150–400)
RBC: 4.2 MIL/uL (ref 3.87–5.11)
RDW: 13.7 % (ref 11.5–15.5)
WBC: 9.2 10*3/uL (ref 4.0–10.5)

## 2012-05-24 LAB — BASIC METABOLIC PANEL
BUN: 10 mg/dL (ref 6–23)
CO2: 29 mEq/L (ref 19–32)
Calcium: 9.6 mg/dL (ref 8.4–10.5)
Chloride: 102 mEq/L (ref 96–112)
Creatinine, Ser: 0.77 mg/dL (ref 0.50–1.10)
GFR calc Af Amer: >90 mL/min (ref >90)
GFR calc non Af Amer: >90 mL/min (ref >90)
Glucose, Bld: 104 mg/dL — ABNORMAL HIGH (ref 70–99)
Potassium: 3.8 mEq/L (ref 3.5–5.1)
Sodium: 139 mEq/L (ref 135–145)

## 2012-05-24 NOTE — ED Notes (Signed)
MD at bedside.Blood pressure 139/81, pulse 78, temperature 98.6 F (37 C), temperature source Oral, resp. rate 18, weight 225 lb (102.059 kg), last menstrual period 04/17/2011, SpO2 100.00%.

## 2012-05-24 NOTE — ED Provider Notes (Signed)
History     CSN: [id number]  Arrival date & time 05/24/12  2048   First MD Initiated Contact with Patient 05/24/12 2124      Chief Complaint  Patient presents with  . Numbness    (Consider location/radiation/quality/duration/timing/severity/associated sxs/prior treatment) The history is provided by the patient.   47 year old female has noted onset about a week ago of intermittent numbness of her left arm. The numbness seems to radiate up towards her neck. Episodes last an estimated 10-15 minutes before resolving. They do not seem to be related to body position or activity. Even when present, numbness is not affected by anything that she does. Nothing makes it better nothing makes it worse. She's not noticed any weakness. There is one occasion on coming to the hospital that she felt like she might be swallowing her tongue but she sat up and felt better. Symptoms are staying stable over the week. She denies headache, nausea, vomiting. She's not had any similar episodes in the past.  Past Medical History  Diagnosis Date  . DUB (dysfunctional uterine bleeding)   . PONV (postoperative nausea and vomiting)   . Hemorrhoids   . Headache   . Seizures     caused by fever as child - no seizures since childhood, no meds  . Anxiety     no meds  . Obesity (BMI 30-39.9)     BMI 30    Past Surgical History  Procedure Laterality Date  . Tubal ligation  07/2005  . Laparoscopy  11/2004  . Tonsillectomy and adenoidectomy    . Wisdom tooth extraction    . Multiple tooth extractions    . Svd      x 2  . Cystoscopy  04/22/2011    Procedure: CYSTOSCOPY;  Surgeon: Myra C. Marice Potter, MD;  Location: [facility];  Service: Gynecology;  Laterality: N/A;    Family History  Problem Relation Age of Onset  . Cancer Father   . Lung disease Mother     History  Substance Use Topics  . Smoking status: Never Smoker   . Smokeless tobacco: Never Used  . Alcohol Use: No    OB History   Grav Para Term Preterm  Abortions TAB SAB Ect Mult Living   2 2        2       Review of Systems  All other systems reviewed and are negative.    Allergies  Latex and Penicillins  Home Medications   Current Outpatient Rx  Name  Route  Sig  Dispense  Refill  . aspirin-acetaminophen-caffeine (EXCEDRIN MIGRAINE) 250-250-65 MG per tablet   Oral   Take 2 tablets by mouth daily as needed. Headache           BP 146/68  Pulse 78  Temp(Src) 98.6 F (37 C) (Oral)  Resp 18  Wt 225 lb (102.059 kg)  BMI 34.7 kg/m2  SpO2 100%  LMP 04/17/2011  Physical Exam  Nursing note and vitals reviewed.  47 year old female, resting comfortably and in no acute distress. Vital signs are significant for mild hypertension with blood pressure 146/68. Oxygen saturation is 100%, which is normal. Head is normocephalic and atraumatic. PERRLA, EOMI. Oropharynx is clear. Fundi show no hemorrhage, exudate, or papilledema. Neck is nontender and supple without adenopathy or JVD. There no carotid bruits. Back is nontender and there is no CVA tenderness. Lungs are clear without rales, wheezes, or rhonchi. Chest is nontender. Heart has regular rate and rhythm without murmur.  Abdomen is soft, flat, nontender without masses or hepatosplenomegaly and peristalsis is normoactive. Extremities have 1+ edema, full range of motion is present. Peripheral pulses are all strong and capillary refill is prompt. Skin is warm and dry without rash. Neurologic: Mental status is normal, cranial nerves are intact, there are no motor or sensory deficits. There is no pronator drift. There is no extinction on double simultaneous stimulation.  ED Course  Procedures (including critical care time)  Results for orders placed during the hospital encounter of 05/24/12  CBC WITH DIFFERENTIAL      Result Value Range   WBC 9.2  4.0 - 10.5 K/uL   RBC 4.20  3.87 - 5.11 MIL/uL   Hemoglobin 12.3  12.0 - 15.0 g/dL   HCT 65.7  84.6 - 96.2 %   MCV 87.9  78.0 -  100.0 fL   MCH 29.3  26.0 - 34.0 pg   MCHC 33.3  30.0 - 36.0 g/dL   RDW 95.2  84.1 - 32.4 %   Platelets 218  150 - 400 K/uL   Neutrophils Relative 64  43 - 77 %   Neutro Abs 5.9  1.7 - 7.7 K/uL   Lymphocytes Relative 28  12 - 46 %   Lymphs Abs 2.6  0.7 - 4.0 K/uL   Monocytes Relative 7  3 - 12 %   Monocytes Absolute 0.6  0.1 - 1.0 K/uL   Eosinophils Relative 1  0 - 5 %   Eosinophils Absolute 0.1  0.0 - 0.7 K/uL   Basophils Relative 0  0 - 1 %   Basophils Absolute 0.0  0.0 - 0.1 K/uL  BASIC METABOLIC PANEL      Result Value Range   Sodium 139  135 - 145 mEq/L   Potassium 3.8  3.5 - 5.1 mEq/L   Chloride 102  96 - 112 mEq/L   CO2 29  19 - 32 mEq/L   Glucose, Bld 104 (*) 70 - 99 mg/dL   BUN 10  6 - 23 mg/dL   Creatinine, Ser 4.01  0.50 - 1.10 mg/dL   Calcium 9.6  8.4 - 02.7 mg/dL   GFR calc non Af Amer >90  >90 mL/min   GFR calc Af Amer >90  >90 mL/min   Ct Head Wo Contrast  05/24/2012  *RADIOLOGY REPORT*  Clinical Data:  Left arm numbness  CT HEAD WITHOUT CONTRAST CT CERVICAL SPINE WITHOUT CONTRAST  Technique:  Multidetector CT imaging of the head and cervical spine was performed following the standard protocol without intravenous contrast.  Multiplanar CT image reconstructions of the cervical spine were also generated.  Comparison:   None  CT HEAD  Findings: No mass effect, midline shift, or acute intracranial hemorrhage.  Mastoid air cells and visualized paranasal sinuses are clear.  IMPRESSION: Negative head CT.  CT CERVICAL SPINE  Findings: No acute fracture.  No dislocation.  Reversal of the normal cervical lordosis.  C3-4:  Right facet arthropathy mildly encroaching upon the foramen.  C4-5:  Prominent right paracentral disc osteophyte and disc bulge complex indents the anterior aspect of the right side of the cord.  C5-6:  Posterior disc osteophyte and disc bulge complex results and significant spinal stenosis.  Uncovertebral osteophytes result in significant left foraminal narrowing.   C6-7:  Mild posterior osteophytes.  IMPRESSION: No acute bony pathology.  Degenerative changes most prominent at C4-5 and C5-6.  There is spinal stenosis at these levels as well as left foraminal narrowing at C5-6  with C6 nerve root encroachment.   Original Report Authenticated By: Jolaine Click, M.D.    Ct Cervical Spine Wo Contrast  05/24/2012  *RADIOLOGY REPORT*  Clinical Data:  Left arm numbness  CT HEAD WITHOUT CONTRAST CT CERVICAL SPINE WITHOUT CONTRAST  Technique:  Multidetector CT imaging of the head and cervical spine was performed following the standard protocol without intravenous contrast.  Multiplanar CT image reconstructions of the cervical spine were also generated.  Comparison:   None  CT HEAD  Findings: No mass effect, midline shift, or acute intracranial hemorrhage.  Mastoid air cells and visualized paranasal sinuses are clear.  IMPRESSION: Negative head CT.  CT CERVICAL SPINE  Findings: No acute fracture.  No dislocation.  Reversal of the normal cervical lordosis.  C3-4:  Right facet arthropathy mildly encroaching upon the foramen.  C4-5:  Prominent right paracentral disc osteophyte and disc bulge complex indents the anterior aspect of the right side of the cord.  C5-6:  Posterior disc osteophyte and disc bulge complex results and significant spinal stenosis.  Uncovertebral osteophytes result in significant left foraminal narrowing.  C6-7:  Mild posterior osteophytes.  IMPRESSION: No acute bony pathology.  Degenerative changes most prominent at C4-5 and C5-6.  There is spinal stenosis at these levels as well as left foraminal narrowing at C5-6 with C6 nerve root encroachment.   Original Report Authenticated By: Jolaine Click, M.D.       1. Paresthesia of left arm   2. Spinal stenosis in cervical region   3. Osteoarthritis cervical spine       MDM  Episodes of weakness in the left arm without objective neurologic findings. CT of head and cervical spine will be obtained but I think the  yield from this will be very low. If workup is negative, she will be referred to neurology. Symptoms may be functional but could also be early symptoms of multiple sclerosis.  CT scan shows evidence of spinal stenosis will and also cervical arthritis with osteophyte impinging on C6 nerve root on the left. This could very well be the cause of her numbness. EMG would likely clarify whether there is functional nerve root compression. She is referred to on-call neurosurgery for followup.     Dione Booze, MD 05/24/12 [phone]

## 2012-05-24 NOTE — ED Notes (Signed)
Pt c/o numbness/tingling to L arm radiating up to neck, onset 1300 today. Pt states after dinner felt as if she was swallowing tongue. A & O. No neuro deficits noted.

## 2012-06-11 ENCOUNTER — Other Ambulatory Visit: Payer: Self-pay | Admitting: Neurosurgery

## 2012-06-11 DIAGNOSIS — M542 Cervicalgia: Secondary | ICD-10-CM

## 2012-06-20 ENCOUNTER — Other Ambulatory Visit: Payer: BC Managed Care – PPO

## 2012-07-03 ENCOUNTER — Other Ambulatory Visit: Payer: BC Managed Care – PPO

## 2012-08-22 ENCOUNTER — Emergency Department (HOSPITAL_COMMUNITY): Payer: BC Managed Care – PPO

## 2012-08-22 ENCOUNTER — Encounter (HOSPITAL_COMMUNITY): Payer: Self-pay | Admitting: Emergency Medicine

## 2012-08-22 ENCOUNTER — Emergency Department (HOSPITAL_COMMUNITY)
Admission: EM | Admit: 2012-08-22 | Discharge: 2012-08-22 | Disposition: A | Discharge status: Home / Self Care | Payer: BC Managed Care – PPO | Attending: Emergency Medicine | Admitting: Emergency Medicine

## 2012-08-22 DIAGNOSIS — Z8719 Personal history of other diseases of the digestive system: Secondary | ICD-10-CM | POA: Insufficient documentation

## 2012-08-22 DIAGNOSIS — Z8659 Personal history of other mental and behavioral disorders: Secondary | ICD-10-CM | POA: Insufficient documentation

## 2012-08-22 DIAGNOSIS — R61 Generalized hyperhidrosis: Secondary | ICD-10-CM | POA: Insufficient documentation

## 2012-08-22 DIAGNOSIS — Z9851 Tubal ligation status: Secondary | ICD-10-CM | POA: Insufficient documentation

## 2012-08-22 DIAGNOSIS — N23 Unspecified renal colic: Secondary | ICD-10-CM

## 2012-08-22 DIAGNOSIS — Z8669 Personal history of other diseases of the nervous system and sense organs: Secondary | ICD-10-CM | POA: Insufficient documentation

## 2012-08-22 DIAGNOSIS — Z9109 Other allergy status, other than to drugs and biological substances: Secondary | ICD-10-CM | POA: Insufficient documentation

## 2012-08-22 DIAGNOSIS — Z8742 Personal history of other diseases of the female genital tract: Secondary | ICD-10-CM | POA: Insufficient documentation

## 2012-08-22 DIAGNOSIS — Z8679 Personal history of other diseases of the circulatory system: Secondary | ICD-10-CM | POA: Insufficient documentation

## 2012-08-22 DIAGNOSIS — Z91040 Latex allergy status: Secondary | ICD-10-CM | POA: Insufficient documentation

## 2012-08-22 DIAGNOSIS — R112 Nausea with vomiting, unspecified: Secondary | ICD-10-CM | POA: Insufficient documentation

## 2012-08-22 DIAGNOSIS — E669 Obesity, unspecified: Secondary | ICD-10-CM | POA: Insufficient documentation

## 2012-08-22 DIAGNOSIS — Z880 Allergy status to penicillin: Secondary | ICD-10-CM | POA: Insufficient documentation

## 2012-08-22 DIAGNOSIS — R300 Dysuria: Secondary | ICD-10-CM | POA: Insufficient documentation

## 2012-08-22 DIAGNOSIS — N201 Calculus of ureter: Secondary | ICD-10-CM

## 2012-08-22 LAB — COMPREHENSIVE METABOLIC PANEL
ALT: 15 U/L (ref 0–35)
AST: 22 U/L (ref 0–37)
Albumin: 3.9 g/dL (ref 3.5–5.2)
Alkaline Phosphatase: 72 U/L (ref 39–117)
BUN: 12 mg/dL (ref 6–23)
CO2: 21 mEq/L (ref 19–32)
Calcium: 9.2 mg/dL (ref 8.4–10.5)
Chloride: 104 mEq/L (ref 96–112)
Creatinine, Ser: 0.89 mg/dL (ref 0.50–1.10)
GFR calc Af Amer: 89 mL/min — ABNORMAL LOW (ref >90)
GFR calc non Af Amer: 77 mL/min — ABNORMAL LOW (ref >90)
Glucose, Bld: 123 mg/dL — ABNORMAL HIGH (ref 70–99)
Potassium: 3.4 mEq/L — ABNORMAL LOW (ref 3.5–5.1)
Sodium: 138 mEq/L (ref 135–145)
Total Bilirubin: 0.4 mg/dL (ref 0.3–1.2)
Total Protein: 7.8 g/dL (ref 6.0–8.3)

## 2012-08-22 LAB — CBC WITH DIFFERENTIAL/PLATELET
Basophils Absolute: 0 10*3/uL (ref 0.0–0.1)
Basophils Relative: 0 % (ref 0–1)
Eosinophils Absolute: 0.1 10*3/uL (ref 0.0–0.7)
Eosinophils Relative: 0 % (ref 0–5)
HCT: 38.9 % (ref 36.0–46.0)
Hemoglobin: 13.4 g/dL (ref 12.0–15.0)
Lymphocytes Relative: 17 % (ref 12–46)
Lymphs Abs: 2.2 10*3/uL (ref 0.7–4.0)
MCH: 29.6 pg (ref 26.0–34.0)
MCHC: 34.4 g/dL (ref 30.0–36.0)
MCV: 86.1 fL (ref 78.0–100.0)
Monocytes Absolute: 0.7 10*3/uL (ref 0.1–1.0)
Monocytes Relative: 6 % (ref 3–12)
Neutro Abs: 10.1 10*3/uL — ABNORMAL HIGH (ref 1.7–7.7)
Neutrophils Relative %: 77 % (ref 43–77)
Platelets: 210 10*3/uL (ref 150–400)
RBC: 4.52 MIL/uL (ref 3.87–5.11)
RDW: 13.5 % (ref 11.5–15.5)
WBC: 13.1 10*3/uL — ABNORMAL HIGH (ref 4.0–10.5)

## 2012-08-22 LAB — URINALYSIS, ROUTINE W REFLEX MICROSCOPIC
Glucose, UA: NEGATIVE mg/dL
Ketones, ur: NEGATIVE mg/dL
Leukocytes, UA: NEGATIVE
Nitrite: NEGATIVE
Protein, ur: NEGATIVE mg/dL
Specific Gravity, Urine: 1.034 — ABNORMAL HIGH (ref 1.005–1.030)
Urobilinogen, UA: 0.2 mg/dL (ref 0.0–1.0)
pH: 5 (ref 5.0–8.0)

## 2012-08-22 LAB — URINE MICROSCOPIC-ADD ON

## 2012-08-22 MED ORDER — TAMSULOSIN HCL 0.4 MG PO CAPS: 0.4000 mg | ORAL_CAPSULE | Freq: Every day | ORAL | Status: DC

## 2012-08-22 MED ORDER — OXYCODONE-ACETAMINOPHEN 5-325 MG PO TABS
1.0000 | ORAL_TABLET | Freq: Once | ORAL | Status: AC
  Administered 2012-08-22: 1 via ORAL
  Filled 2012-08-22: qty 1

## 2012-08-22 MED ORDER — HYDROMORPHONE HCL PF 1 MG/ML IJ SOLN
1.0000 mg | Freq: Once | INTRAMUSCULAR | Status: AC
  Administered 2012-08-22: 1 mg via INTRAVENOUS
  Filled 2012-08-22: qty 1

## 2012-08-22 MED ORDER — SODIUM CHLORIDE 0.9 % IV SOLN
1000.0000 mL | INTRAVENOUS | Status: DC
  Administered 2012-08-22: 1000 mL via INTRAVENOUS

## 2012-08-22 MED ORDER — ONDANSETRON 4 MG PO TBDP
ORAL_TABLET | ORAL | Status: AC
  Administered 2012-08-22: 8 mg via ORAL
  Filled 2012-08-22: qty 2

## 2012-08-22 MED ORDER — OXYCODONE-ACETAMINOPHEN 5-325 MG PO TABS: 1.0000 | ORAL_TABLET | ORAL | Status: DC | PRN

## 2012-08-22 MED ORDER — KETOROLAC TROMETHAMINE 30 MG/ML IJ SOLN
30.0000 mg | Freq: Once | INTRAMUSCULAR | Status: AC
  Administered 2012-08-22: 30 mg via INTRAVENOUS
  Filled 2012-08-22: qty 1

## 2012-08-22 MED ORDER — ONDANSETRON 4 MG PO TBDP
8.0000 mg | ORAL_TABLET | Freq: Once | ORAL | Status: AC
  Administered 2012-08-22: 8 mg via ORAL

## 2012-08-22 MED ORDER — NAPROXEN 500 MG PO TABS: 500.0000 mg | ORAL_TABLET | Freq: Two times a day (BID) | ORAL | Status: DC

## 2012-08-22 MED ORDER — ONDANSETRON HCL 4 MG/2ML IJ SOLN
4.0000 mg | Freq: Once | INTRAMUSCULAR | Status: AC
  Administered 2012-08-22: 4 mg via INTRAVENOUS
  Filled 2012-08-22: qty 2

## 2012-08-22 MED ORDER — SODIUM CHLORIDE 0.9 % IV SOLN
1000.0000 mL | Freq: Once | INTRAVENOUS | Status: AC
  Administered 2012-08-22: 1000 mL via INTRAVENOUS

## 2012-08-22 NOTE — ED Provider Notes (Signed)
History    CSN: [id number] Arrival date & time 08/22/12  0345  First MD Initiated Contact with Patient 08/22/12 0518    Chief complaint: Abdominal pain   (Consider location/radiation/quality/duration/timing/severity/associated sxs/prior Treatment) Patient is a 47 y.o. female presenting with abdominal pain. The history is provided by the patient.  Abdominal Pain Associated symptoms include abdominal pain.  She was awakened at about 3 AM with severe left lower quadrant pain with some radiation to the back. Pain is sharp and she rates it 10/10. Nothing makes it better nothing makes it worse. There is associated nausea and vomiting. She is diaphoretic but denies fever or chills. She notes that she is having difficulty passing urine. She's never had pain like this before. She did not do anything at home to try and help it. Past Medical History  Diagnosis Date  . DUB (dysfunctional uterine bleeding)   . PONV (postoperative nausea and vomiting)   . Hemorrhoids   . Headache(784.0)   . Seizures     caused by fever as child - no seizures since childhood, no meds  . Anxiety     no meds  . Obesity (BMI 30-39.9)     BMI 30   Past Surgical History  Procedure Laterality Date  . Tubal ligation  07/2005  . Laparoscopy  11/2004  . Tonsillectomy and adenoidectomy    . Wisdom tooth extraction    . Multiple tooth extractions    . Svd      x 2  . Cystoscopy  04/22/2011    Procedure: CYSTOSCOPY;  Surgeon: Myra C. Marice Potter, MD;  Location: [facility];  Service: Gynecology;  Laterality: N/A;   Family History  Problem Relation Age of Onset  . Cancer Father   . Lung disease Mother    History  Substance Use Topics  . Smoking status: Never Smoker   . Smokeless tobacco: Never Used  . Alcohol Use: No   OB History   Grav Para Term Preterm Abortions TAB SAB Ect Mult Living   2 2        2      Review of Systems  Gastrointestinal: Positive for abdominal pain.  All other systems reviewed and are  negative.    Allergies  Latex and Penicillins  Home Medications   Current Outpatient Rx  Name  Route  Sig  Dispense  Refill  . aspirin-acetaminophen-caffeine (EXCEDRIN MIGRAINE) 250-250-65 MG per tablet   Oral   Take 2 tablets by mouth daily as needed. Headache          BP 151/90  Pulse 61  Temp(Src) 97.8 F (36.6 C) (Oral)  Resp 22  SpO2 97%  LMP 04/17/2011 Physical Exam  Nursing note and vitals reviewed.  47 year old female, who appears to be in pain, but his in no acute distress. Vital signs are significant for hypertension with blood pressure 151/90, and tachypnea with respiratory rate of 22. Oxygen saturation is 97%, which is normal. Head is normocephalic and atraumatic. PERRLA, EOMI. Oropharynx is clear. Neck is nontender and supple without adenopathy or JVD. Back is nontender in the midline. There is moderate left CVA tenderness. Lungs are clear without rales, wheezes, or rhonchi. Chest is nontender. Heart has regular rate and rhythm without murmur. Abdomen is soft, flat, with moderate left lower quadrant tenderness. There is no rebound or guarding. There are no masses or hepatosplenomegaly and peristalsis is hypoactive. Extremities have no cyanosis or edema, full range of motion is present. Skin is warm and  dry without rash. Neurologic: Mental status is normal, cranial nerves are intact, there are no motor or sensory deficits.  ED Course  Procedures (including critical care time) Results for orders placed during the hospital encounter of 08/22/12  CBC WITH DIFFERENTIAL      Result Value Range   WBC 13.1 (*) 4.0 - 10.5 K/uL   RBC 4.52  3.87 - 5.11 MIL/uL   Hemoglobin 13.4  12.0 - 15.0 g/dL   HCT 11.9  14.7 - 82.9 %   MCV 86.1  78.0 - 100.0 fL   MCH 29.6  26.0 - 34.0 pg   MCHC 34.4  30.0 - 36.0 g/dL   RDW 56.2  13.0 - 86.5 %   Platelets 210  150 - 400 K/uL   Neutrophils Relative % 77  43 - 77 %   Neutro Abs 10.1 (*) 1.7 - 7.7 K/uL   Lymphocytes Relative  17  12 - 46 %   Lymphs Abs 2.2  0.7 - 4.0 K/uL   Monocytes Relative 6  3 - 12 %   Monocytes Absolute 0.7  0.1 - 1.0 K/uL   Eosinophils Relative 0  0 - 5 %   Eosinophils Absolute 0.1  0.0 - 0.7 K/uL   Basophils Relative 0  0 - 1 %   Basophils Absolute 0.0  0.0 - 0.1 K/uL  COMPREHENSIVE METABOLIC PANEL      Result Value Range   Sodium 138  135 - 145 mEq/L   Potassium 3.4 (*) 3.5 - 5.1 mEq/L   Chloride 104  96 - 112 mEq/L   CO2 21  19 - 32 mEq/L   Glucose, Bld 123 (*) 70 - 99 mg/dL   BUN 12  6 - 23 mg/dL   Creatinine, Ser 7.84  0.50 - 1.10 mg/dL   Calcium 9.2  8.4 - 69.6 mg/dL   Total Protein 7.8  6.0 - 8.3 g/dL   Albumin 3.9  3.5 - 5.2 g/dL   AST 22  0 - 37 U/L   ALT 15  0 - 35 U/L   Alkaline Phosphatase 72  39 - 117 U/L   Total Bilirubin 0.4  0.3 - 1.2 mg/dL   GFR calc non Af Amer 77 (*) >90 mL/min   GFR calc Af Amer 89 (*) >90 mL/min  URINALYSIS, ROUTINE W REFLEX MICROSCOPIC      Result Value Range   Color, Urine AMBER (*) YELLOW   APPearance TURBID (*) CLEAR   Specific Gravity, Urine 1.034 (*) 1.005 - 1.030   pH 5.0  5.0 - 8.0   Glucose, UA NEGATIVE  NEGATIVE mg/dL   Hgb urine dipstick LARGE (*) NEGATIVE   Bilirubin Urine SMALL (*) NEGATIVE   Ketones, ur NEGATIVE  NEGATIVE mg/dL   Protein, ur NEGATIVE  NEGATIVE mg/dL   Urobilinogen, UA 0.2  0.0 - 1.0 mg/dL   Nitrite NEGATIVE  NEGATIVE   Leukocytes, UA NEGATIVE  NEGATIVE  URINE MICROSCOPIC-ADD ON      Result Value Range   Squamous Epithelial / LPF RARE  RARE   WBC, UA 0-2  <3 WBC/hpf   RBC / HPF 3-6  <3 RBC/hpf   Bacteria, UA MANY (*) RARE   Urine-Other AMORPHOUS URATES/PHOSPHATES     Ct Abdomen Pelvis Wo Contrast  08/22/2012   *RADIOLOGY REPORT*  Clinical Data:  Left lower quadrant pain.  Dysuria.  CT ABDOMEN AND PELVIS WITHOUT CONTRAST (CT UROGRAM)  Technique: Contiguous axial images of the abdomen and pelvis without oral  or intravenous contrast were obtained.  Comparison: Pelvic ultrasound 12/28/2010.  No prior CT.   Findings:  Exam is limited for evaluation of entities other than urinary tract calculi due to lack of oral or intravenous contrast.   Lung bases:  Patchy subsegmental atelectasis at the lung bases. Normal heart size without pericardial or pleural effusion.  Abdomen/pelvis:  Normal uninfused appearance of the liver, spleen, stomach, pancreas, gallbladder, biliary tract, adrenal glands, right kidney.  Left nephrolithiasis.  Mild to moderate left-sided hydroureteronephrosis.  There is a small punctate distal left ureteric stone on image 90/series 2.  Punctate stone at the left ureter vesicular junction on image 92/series 2.  Multiple other pelvic calcifications which are felt to be phleboliths.  No right hydroureter or right ureteric stone.  No retroperitoneal or retrocrural adenopathy.  Normal colon, appendix, and terminal ileum.  Normal small bowel without abdominal ascites.    No pelvic adenopathy.  Hysterectomy. No adnexal mass or significant free fluid.  Bones/Musculoskeletal:  No acute osseous abnormality.  IMPRESSION:  1.  Mild to moderate left-sided urinary tract obstruction secondary to a punctate ureterovesicular junction calculus. 2.  Possible distal left ureteric punctate calculus. 3.  Left nephrolithiasis.   Original Report Authenticated By: Jeronimo Greaves, M.D.      1. Ureterolithiasis   2. Ureteral colic     MDM  Left lower quadrant and flank pain suggestive of ureteral colic, consider possibility of urinary tract infection. Urinalysis does show 3-6 RBCs but also many bacteria. Urinate and phosphate crystals are also present. She'll be sent for CT scan to evaluate possible ureterolithiasis. Old records are reviewed and she has no prior CT scans of her abdomen. She'll be given IV fluids, IV hydromorphone, and IV ondansetron.  CT confirms ureteral calculus with hydronephrosis. She got good pain relief initially with above-noted medications but pain is recurring. She will be given a dose of  ketorolac and additional dose of hydromorphone.  She got good relief for the above-noted treatment. She is discharged with prescriptions for naproxen, oxycodone-acetaminophen, and tamsulosin. She is referred to on-call urology for followup.  Dione Booze, MD 08/22/12 [phone]

## 2012-08-22 NOTE — ED Notes (Signed)
Pt given d/c teaching and prescriptions. Pt verbalizes understanding of d/c teaching and has no further questions upon d/c. Pt instructed not to drive. Pt endorses she will not drive and husband at bedside states he will drive home. NAD noted upon d/c. Pt requested wheelchair to car. Pt helped into vehicle.

## 2012-08-22 NOTE — ED Notes (Signed)
PT. REPORTS LLQ PAIN WITH NAUSEA ONSET THIS MORNING , DENIES URINARY SYMPTOMS / NO FEVER OR CHILLS.

## 2012-08-22 NOTE — ED Notes (Signed)
0350  Pt arrives to the ER with LLQ Pain that started this morning.  Also C/O some nausea.  Pain woke the pt up from sleeping and it brought on a cold sweat.  Pt was able to ambulate to the restroom and provide a urine sample.  Waiting on DR

## 2012-08-27 ENCOUNTER — Encounter: Payer: Self-pay | Admitting: Family Medicine

## 2012-08-27 ENCOUNTER — Ambulatory Visit (INDEPENDENT_AMBULATORY_CARE_PROVIDER_SITE_OTHER): Payer: BC Managed Care – PPO | Admitting: Family Medicine

## 2012-08-27 VITALS — BP 118/75 | HR 69 | Temp 98.5°F | Wt 219.0 lb

## 2012-08-27 DIAGNOSIS — N200 Calculus of kidney: Secondary | ICD-10-CM | POA: Insufficient documentation

## 2012-08-27 HISTORY — DX: Calculus of kidney: N20.0

## 2012-08-27 MED ORDER — TAMSULOSIN HCL 0.4 MG PO CAPS: 0.4000 mg | ORAL_CAPSULE | Freq: Every day | ORAL | Status: DC

## 2012-08-27 NOTE — Patient Instructions (Signed)

## 2012-08-27 NOTE — Progress Notes (Signed)
CC: Erin Travis is a 47 y.o. female is here for Establish Care and Nephrolithiasis   Subjective: HPI:  Pleasant 47 year old here to establish care  Patient reports sudden onset left flank pain diaphoresis that occurred last Friday night. Accompanied by urinary hesitancy. Reported 10 out of 10 pain that was radiating into the vagina. She was seen a local emergency room CT scan revealing nephrolithiasis mild hydronephrosis.  At that time she was having frank hematuria. She was started on Flomax, oxycodone and has been straining her urine but has not found any sediment. She reports her pain is currently 1/10 at the most described as left upper posterior pelvis nothing makes better or worse. Urine has been clear for the past 48 hours. She refuses to take oxycodone, she is taking Flomax on a daily basis. Pain is described as a cramp that comes and goes throughout the day for no particular reason. Not interfering with sleep. Her husband works for [employer]  she admits to drinking quite a bit of soda on a daily basis.  Review of Systems - General ROS: negative for - chills, fever, night sweats, weight gain or weight loss Ophthalmic ROS: negative for - decreased vision Psychological ROS: negative for - anxiety or depression ENT ROS: negative for - hearing change, nasal congestion, tinnitus or allergies Hematological and Lymphatic ROS: negative for - bleeding problems, bruising or swollen lymph nodes Breast ROS: negative Respiratory ROS: no cough, shortness of breath, or wheezing Cardiovascular ROS: no chest pain or dyspnea on exertion Gastrointestinal ROS: no abdominal pain, change in bowel habits, or black or bloody stools Genito-Urinary ROS: negative for - genital discharge, genital ulcers, incontinence or abnormal bleeding from genitals Musculoskeletal ROS: negative for - joint pain or muscle pain Neurological ROS: negative for - headaches or memory loss Dermatological ROS: negative for lumps, mole  changes, rash and skin lesion changes  Past Medical History  Diagnosis Date  . DUB (dysfunctional uterine bleeding)   . PONV (postoperative nausea and vomiting)   . Hemorrhoids   . Headache(784.0)   . Seizures     caused by fever as child - no seizures since childhood, no meds  . Anxiety     no meds  . Obesity (BMI 30-39.9)     BMI 30     Family History  Problem Relation Age of Onset  . Cancer Father   . Lung disease Mother      History  Substance Use Topics  . Smoking status: Never Smoker   . Smokeless tobacco: Never Used  . Alcohol Use: No     Objective: Filed Vitals:   08/27/12 1111  BP: 118/75  Pulse: 69  Temp: 98.5 F (36.9 C)    General: Alert and Oriented, No Acute Distress HEENT: Pupils equal, round, reactive to light. Conjunctivae clear.  Moist mucous membranes parents unremarkable Lungs: Clear to auscultation bilaterally, no wheezing/ronchi/rales.  Comfortable work of breathing. Good air movement. Cardiac: Regular rate and rhythm. Normal S1/S2.  No murmurs, rubs, nor gallops.   Abdomen: Obese soft nontender Back: No CVA tenderness Extremities: No peripheral edema.  Strong peripheral pulses.  Mental Status: No depression, anxiety, nor agitation. Skin: Warm and dry.  Assessment & Plan: Erin Travis was seen today for establish care and nephrolithiasis.  Diagnoses and associated orders for this visit:  Nephrolithiasis - tamsulosin (FLOMAX) 0.4 MG CAPS; Take 1 capsule (0.4 mg total) by mouth daily.    I have reviewed her CT images myself she has 2 stones that could  be the culprit of her pain from last weekend which are no more than 2 mm in size. She does have mild hydronephrosis on the left in the ureter were the stones are located, discussed my optimism that these will pass without further pain provided she is urinating every 2 hours avoiding tea, soda, and dehydrating liquids. Encouraged her to continue with Flomax prescription provided continue straining  urine, retain stone if found for stone composition. Given significant resolution of pain low suspicion for worsening hydronephrosis.  Return in about 2 weeks (around 09/10/2012).

## 2012-09-10 ENCOUNTER — Encounter: Payer: Self-pay | Admitting: Family Medicine

## 2012-09-10 ENCOUNTER — Ambulatory Visit: Payer: BC Managed Care – PPO | Admitting: Family Medicine

## 2012-09-10 ENCOUNTER — Ambulatory Visit (INDEPENDENT_AMBULATORY_CARE_PROVIDER_SITE_OTHER): Payer: BC Managed Care – PPO | Admitting: Family Medicine

## 2012-09-10 VITALS — BP 124/77 | HR 71 | Wt 220.0 lb

## 2012-09-10 DIAGNOSIS — Z800 Family history of malignant neoplasm of digestive organs: Secondary | ICD-10-CM

## 2012-09-10 DIAGNOSIS — Z13220 Encounter for screening for lipoid disorders: Secondary | ICD-10-CM

## 2012-09-10 DIAGNOSIS — Z131 Encounter for screening for diabetes mellitus: Secondary | ICD-10-CM

## 2012-09-10 DIAGNOSIS — R739 Hyperglycemia, unspecified: Secondary | ICD-10-CM

## 2012-09-10 DIAGNOSIS — R7309 Other abnormal glucose: Secondary | ICD-10-CM

## 2012-09-10 DIAGNOSIS — N200 Calculus of kidney: Secondary | ICD-10-CM

## 2012-09-10 HISTORY — DX: Hyperglycemia, unspecified: R73.9

## 2012-09-10 HISTORY — DX: Family history of malignant neoplasm of digestive organs: Z80.0

## 2012-09-10 LAB — LIPID PANEL
Cholesterol: 165 mg/dL (ref 0–200)
HDL: 45 mg/dL (ref >39)
LDL Cholesterol: 101 mg/dL — ABNORMAL HIGH (ref 0–99)
Total CHOL/HDL Ratio: 3.7 Ratio
Triglycerides: 97 mg/dL (ref <150)
VLDL: 19 mg/dL (ref 0–40)

## 2012-09-10 LAB — BASIC METABOLIC PANEL WITH GFR
BUN: 10 mg/dL (ref 6–23)
CO2: 27 mEq/L (ref 19–32)
Calcium: 9.7 mg/dL (ref 8.4–10.5)
Chloride: 105 mEq/L (ref 96–112)
Creat: 0.76 mg/dL (ref 0.50–1.10)
GFR, Est African American: >89 mL/min
GFR, Est Non African American: >89 mL/min
Glucose, Bld: 84 mg/dL (ref 70–99)
Potassium: 4.4 mEq/L (ref 3.5–5.3)
Sodium: 141 mEq/L (ref 135–145)

## 2012-09-10 NOTE — Progress Notes (Signed)
CC: Erin Travis is a 47 y.o. female is here for 2 weeks f/u   Subjective: HPI:  Followup nephrolithiasis: Since I saw her last she has had no flank pain pelvic pain nor abdominal pain. She has stopped taking one week after I saw her due to lightheadedness. She has not taken any pain medication since I saw her last. She denies fevers, chills, dysuria, darkening urine, blood in urine. She was unable to catch the stone in her collection kit, she is unsure whether or not she passed it  Hypoglycemia: During her renal colic episode blood sugar was 123, she is unsure the last time she had fasting blood sugar checked.    Patient is concerned about family history of throat cancer. Father was a smoker however she believes pathology showed a metastatic throat cancer that was not attributed to tobacco exposure. He had a history of esophageal dysmotility with a epiglottis it would not function properly throughout his whole life.  She wants to know what she can do to minimize her chances of developing this. She denies unintentional weight loss, swollen lymph nodes, night sweats, fevers, chills, and dysphagia     Review Of Systems Outlined In HPI  Past Medical History  Diagnosis Date  . DUB (dysfunctional uterine bleeding)   . PONV (postoperative nausea and vomiting)   . Hemorrhoids   . Headache(784.0)   . Seizures     caused by fever as child - no seizures since childhood, no meds  . Anxiety     no meds  . Obesity (BMI 30-39.9)     BMI 30     Family History  Problem Relation Age of Onset  . Cancer Father   . Lung disease Mother      History  Substance Use Topics  . Smoking status: Never Smoker   . Smokeless tobacco: Never Used  . Alcohol Use: No     Objective: Filed Vitals:   09/10/12 0957  BP: 124/77  Pulse: 71    General: Alert and Oriented, No Acute Distress HEENT: Pupils equal, round, reactive to light. Conjunctivae clear.  External ears unremarkable, canals clear with  intact TMs with appropriate landmarks.  Middle ear appears open without effusion. Pink inferior turbinates.  Moist mucous membranes, pharynx without inflammation nor lesions.  Neck supple without palpable lymphadenopathy nor abnormal masses. No supraclavicular lymph nodes Lungs: Clear to auscultation bilaterally, no wheezing/ronchi/rales.  Comfortable work of breathing. Good air movement. Cardiac: Regular rate and rhythm. Normal S1/S2.  No murmurs, rubs, nor gallops.  . Extremities: No peripheral edema.  Strong peripheral pulses.  Mental Status: No depression, anxiety, nor agitation. Skin: Warm and dry.  Assessment & Plan: Erin Travis was seen today for 2 weeks f/u.  Diagnoses and associated orders for this visit:  Lipid screening - Lipid panel  Nephrolithiasis - BASIC METABOLIC PANEL WITH GFR  Diabetes mellitus screening - BASIC METABOLIC PANEL WITH GFR  Hyperglycemia  Family history of throat cancer    Nephrolithiasis: Controlled and resolved I discussed with her keeping well hydrated avoiding diuretics to help prevent future occurrences. She's due for lipid screening she's fasting Hypoglycemia: Obtain fasting glucose today Family history throat cancer: We discussed the benefits of avoiding alcohol and tobacco which she is are doing along with signs and symptoms to look out for that would be worrisome of throat cancer including but not limited to dysphagia, change in voice, swollen lymph nodes, unintentional weight loss  25 minutes spent face-to-face during visit today of  which at least 50% was counseling or coordinating care regarding family history throat cancer, hyperglycemia, nephrolithiasis, lipid screening.   Return if symptoms worsen or fail to improve.

## 2012-12-31 ENCOUNTER — Other Ambulatory Visit: Payer: Self-pay

## 2013-03-01 ENCOUNTER — Other Ambulatory Visit: Payer: Self-pay | Admitting: Obstetrics & Gynecology

## 2013-03-01 DIAGNOSIS — Z1231 Encounter for screening mammogram for malignant neoplasm of breast: Secondary | ICD-10-CM

## 2013-04-09 ENCOUNTER — Ambulatory Visit (HOSPITAL_COMMUNITY)
Admission: RE | Admit: 2013-04-09 | Discharge: 2013-04-09 | Disposition: A | Discharge status: Home / Self Care | Payer: PRIVATE HEALTH INSURANCE | Source: Ambulatory Visit | Attending: Obstetrics & Gynecology | Admitting: Obstetrics & Gynecology

## 2013-04-09 DIAGNOSIS — Z1231 Encounter for screening mammogram for malignant neoplasm of breast: Secondary | ICD-10-CM

## 2013-04-14 ENCOUNTER — Other Ambulatory Visit: Payer: Self-pay | Admitting: Obstetrics & Gynecology

## 2013-04-14 DIAGNOSIS — R928 Other abnormal and inconclusive findings on diagnostic imaging of breast: Secondary | ICD-10-CM

## 2013-04-20 ENCOUNTER — Ambulatory Visit
Admission: RE | Admit: 2013-04-20 | Discharge: 2013-04-20 | Disposition: A | Discharge status: Home / Self Care | Payer: PRIVATE HEALTH INSURANCE | Source: Ambulatory Visit | Attending: Obstetrics & Gynecology | Admitting: Obstetrics & Gynecology

## 2013-04-20 ENCOUNTER — Other Ambulatory Visit: Payer: Self-pay | Admitting: Obstetrics & Gynecology

## 2013-04-20 DIAGNOSIS — R928 Other abnormal and inconclusive findings on diagnostic imaging of breast: Secondary | ICD-10-CM

## 2013-04-30 ENCOUNTER — Ambulatory Visit (INDEPENDENT_AMBULATORY_CARE_PROVIDER_SITE_OTHER): Payer: PRIVATE HEALTH INSURANCE | Admitting: General Surgery

## 2013-04-30 ENCOUNTER — Encounter (INDEPENDENT_AMBULATORY_CARE_PROVIDER_SITE_OTHER): Payer: Self-pay | Admitting: General Surgery

## 2013-04-30 VITALS — BP 128/88 | HR 78 | Temp 98.9°F | Resp 14 | Ht 67.0 in | Wt 199.0 lb

## 2013-04-30 DIAGNOSIS — D249 Benign neoplasm of unspecified breast: Secondary | ICD-10-CM

## 2013-04-30 DIAGNOSIS — D242 Benign neoplasm of left breast: Secondary | ICD-10-CM | POA: Insufficient documentation

## 2013-04-30 HISTORY — DX: Benign neoplasm of left breast: D24.2

## 2013-04-30 NOTE — Progress Notes (Signed)
Patient ID: Erin Travis, female   DOB: Oct 03, 1965, 48 y.o.   MRN: [id number]  Chief Complaint  Patient presents with  . New Evaluation    eval Lt br for excision of phyllodes    HPI Erin Travis is a 48 y.o. female.   HPI  She is referred by Dr. 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 She underwent a mammogram which demonstrated a lesion in the left breast at the 11:00 position. Image guided biopsy demonstrated the above pathology. Wider excision has been recommended to fully characterize the lesion.  No palpable breast masses. No nipple discharge. No family history of breast cancer. Menarche at age 27. She's had a hysterectomy in the past. Age at first live birth was less than 59.  She currently is undergoing a difficult divorce from her husband.  Past Medical History  Diagnosis Date  . DUB (dysfunctional uterine bleeding)   . PONV (postoperative nausea and vomiting)   . Hemorrhoids   . Headache(784.0)   . Seizures     caused by fever as child - no seizures since childhood, no meds  . Anxiety     no meds  . Obesity (BMI 30-39.9)     BMI 30    Past Surgical History  Procedure Laterality Date  . Tubal ligation  07/2005  . Laparoscopy  11/2004  . Tonsillectomy and adenoidectomy    . Wisdom tooth extraction    . Multiple tooth extractions    . Svd      x 2  . Cystoscopy  04/22/2011    Procedure: CYSTOSCOPY;  Surgeon: Myra C. Hulan Fray, MD;  Location: [facility];  Service: Gynecology;  Laterality: N/A;  . Abdominal hysterectomy    . Abdominal hysterectomy      Family History  Problem Relation Age of Onset  . Cancer Father     throat  . Lung disease Mother   . Cancer Paternal Grandmother     stomach    Social History History  Substance Use Topics  . Smoking status: Never Smoker   . Smokeless tobacco: Never Used  . Alcohol Use: No    Allergies  Allergen Reactions  . Latex Itching    Vaginal itching and  irritation  . Penicillins Itching    Current Outpatient Prescriptions  Medication Sig Dispense Refill  . aspirin-acetaminophen-caffeine (EXCEDRIN MIGRAINE) 250-250-65 MG per tablet Take by mouth every 6 (six) hours as needed for headache.       No current facility-administered medications for this visit.    Review of Systems Review of Systems  Constitutional: Negative.   HENT: Negative.   Respiratory: Negative.   Cardiovascular: Negative.   Gastrointestinal: Negative.   Genitourinary: Negative.   Hematological: Negative.     Blood pressure 128/88, pulse 78, temperature 98.9 F (37.2 C), temperature source Oral, resp. rate 14, height 5\' 7"  (1.702 m), weight 199 lb (90.266 kg), last menstrual period 04/17/2011.  Physical Exam Physical Exam  Constitutional: She appears well-developed and well-nourished. No distress.  HENT:  Head: Normocephalic and atraumatic.  Eyes: No scleral icterus.  Neck: Neck supple.  Cardiovascular: Normal rate and regular rhythm.   Pulmonary/Chest: Effort normal and breath sounds normal.  Breasts are symmetrical in size. Right breast demonstrates no dominant masses or suspicious skin changes. Left breast demonstrates some bruising in the medial aspect. No palpable masses.  Musculoskeletal:  No supraclavicular or axillary adenopathy.  Lymphadenopathy:    She has no  cervical adenopathy.  Neurological: She is alert.  Skin: Skin is warm.  Psychiatric: She has a normal mood and affect. Her behavior is normal.    Data Reviewed Mammogram and ultrasound report.Pathology report.  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 Wider excision is recommended.     Plan    Left breast lumpectomy after needle localization.  I have explained the procedure, risks, and aftercare to her.  Risks include but are not limited to bleeding, infection, wound problems, seroma formation, anesthesia.  She  seems to understand and agrees with the plan.        Mychael Soots J 04/30/2013, 11:42 AM

## 2013-04-30 NOTE — Patient Instructions (Signed)
[place] Office Phone Number [phone]  BREAST BIOPSY/ PARTIAL MASTECTOMY: POST OP INSTRUCTIONS  Always review your discharge instruction sheet given to you by the facility where your surgery was performed.  IF YOU HAVE DISABILITY OR FAMILY LEAVE FORMS, YOU MUST BRING THEM TO THE OFFICE FOR PROCESSING.  DO NOT GIVE THEM TO YOUR DOCTOR.  1. A prescription for pain medication may be given to you upon discharge.  Take your pain medication as prescribed, if needed.  If narcotic pain medicine is not needed, then you may take acetaminophen (Tylenol) or ibuprofen (Advil) as needed. 2. Take your usually prescribed medications unless otherwise directed 3. If you need a refill on your pain medication, please contact your pharmacy.  They will contact our office to request authorization.  Prescriptions will not be filled after 5pm or on week-ends. 4. You should eat very light the first 24 hours after surgery, such as soup, crackers, pudding, etc.  Resume your normal diet the day after surgery. 5. Most patients will experience some swelling and bruising in the breast.  Ice packs and a good support bra will help.  Swelling and bruising can take several days to resolve.  6. It is common to experience some constipation if taking pain medication after surgery.  Increasing fluid intake and taking a stool softener will usually help or prevent this problem from occurring.  A mild laxative (Milk of Magnesia or Miralax) should be taken according to package directions if there are no bowel movements after 48 hours. 7. Unless discharge instructions indicate otherwise, you may remove your bandages 48 hours after surgery, and you may shower at that time.  You may have steri-strips (small skin tapes) in place directly over the incision.  These strips should be left on the skin for 14 days.  If your surgeon used skin glue on the incision, you may shower in 24 hours.  The glue will flake off over the next  2-3 weeks.  Any sutures or staples will be removed at the office during your follow-up visit. 8. ACTIVITIES:  You may resume regular daily activities (gradually increasing) beginning the next day.  Wearing a good support bra or sports bra minimizes pain and swelling.  You may have sexual intercourse when it is comfortable. a. You may drive when you no longer are taking prescription pain medication, you can comfortably wear a seatbelt, and you can safely maneuver your car and apply brakes. b. RETURN TO WORK:  ___2=3 days when comfortable.___________________________________________________________________________________ 9. You should see your doctor in the office for a follow-up appointment approximately two weeks after your surgery.  Your doctor's nurse will typically make your follow-up appointment when she calls you with your pathology report.  Expect your pathology report 3 business days after your surgery.  You may call to check if you do not hear from [place] after three days. 10. OTHER INSTRUCTIONS: _______________________________________________________________________________________________ _____________________________________________________________________________________________________________________________________ _____________________________________________________________________________________________________________________________________ _____________________________________________________________________________________________________________________________________  WHEN TO CALL YOUR DOCTOR: 1. Fever over 101.0 2. Nausea and/or vomiting. 3. Extreme swelling or bruising. 4. Continued bleeding from incision. 5. Increased pain, redness, or drainage from the incision.  The clinic staff is available to answer your questions during regular business hours.  Please don't hesitate to call and ask to speak to one of the nurses for clinical concerns.  If you have a medical emergency, go  to the nearest emergency room or call 911.  A surgeon from [facility] is always on call at the hospital.  For further questions, please  visit [email or web address]

## 2013-05-06 ENCOUNTER — Ambulatory Visit (INDEPENDENT_AMBULATORY_CARE_PROVIDER_SITE_OTHER): Payer: PRIVATE HEALTH INSURANCE | Admitting: General Surgery

## 2013-05-06 ENCOUNTER — Encounter (INDEPENDENT_AMBULATORY_CARE_PROVIDER_SITE_OTHER): Payer: Self-pay

## 2013-05-24 ENCOUNTER — Encounter (HOSPITAL_BASED_OUTPATIENT_CLINIC_OR_DEPARTMENT_OTHER): Payer: Self-pay | Admitting: *Deleted

## 2013-05-24 ENCOUNTER — Encounter (HOSPITAL_BASED_OUTPATIENT_CLINIC_OR_DEPARTMENT_OTHER)
Admission: RE | Admit: 2013-05-24 | Discharge: 2013-05-24 | Disposition: A | Discharge status: Home / Self Care | Payer: PRIVATE HEALTH INSURANCE | Source: Ambulatory Visit | Attending: General Surgery | Admitting: General Surgery

## 2013-05-24 DIAGNOSIS — Z01812 Encounter for preprocedural laboratory examination: Secondary | ICD-10-CM | POA: Insufficient documentation

## 2013-05-24 LAB — CBC WITH DIFFERENTIAL/PLATELET
Basophils Absolute: 0 10*3/uL (ref 0.0–0.1)
Basophils Relative: 0 % (ref 0–1)
Eosinophils Absolute: 0 10*3/uL (ref 0.0–0.7)
Eosinophils Relative: 1 % (ref 0–5)
HCT: 40.9 % (ref 36.0–46.0)
Hemoglobin: 13.9 g/dL (ref 12.0–15.0)
Lymphocytes Relative: 21 % (ref 12–46)
Lymphs Abs: 1.5 10*3/uL (ref 0.7–4.0)
MCH: 30.5 pg (ref 26.0–34.0)
MCHC: 34 g/dL (ref 30.0–36.0)
MCV: 89.7 fL (ref 78.0–100.0)
Monocytes Absolute: 0.4 10*3/uL (ref 0.1–1.0)
Monocytes Relative: 5 % (ref 3–12)
Neutro Abs: 5.2 10*3/uL (ref 1.7–7.7)
Neutrophils Relative %: 73 % (ref 43–77)
Platelets: 238 10*3/uL (ref 150–400)
RBC: 4.56 MIL/uL (ref 3.87–5.11)
RDW: 12.8 % (ref 11.5–15.5)
WBC: 7.2 10*3/uL (ref 4.0–10.5)

## 2013-05-24 LAB — COMPREHENSIVE METABOLIC PANEL
ALT: 12 U/L (ref 0–35)
AST: 19 U/L (ref 0–37)
Albumin: 3.7 g/dL (ref 3.5–5.2)
Alkaline Phosphatase: 78 U/L (ref 39–117)
BUN: 8 mg/dL (ref 6–23)
CO2: 26 mEq/L (ref 19–32)
Calcium: 9.5 mg/dL (ref 8.4–10.5)
Chloride: 105 mEq/L (ref 96–112)
Creatinine, Ser: 0.69 mg/dL (ref 0.50–1.10)
GFR calc Af Amer: >90 mL/min (ref >90)
GFR calc non Af Amer: >90 mL/min (ref >90)
Glucose, Bld: 101 mg/dL — ABNORMAL HIGH (ref 70–99)
Potassium: 4.6 mEq/L (ref 3.7–5.3)
Sodium: 144 mEq/L (ref 137–147)
Total Bilirubin: 0.3 mg/dL (ref 0.3–1.2)
Total Protein: 7.3 g/dL (ref 6.0–8.3)

## 2013-05-24 LAB — PROTIME-INR
INR: 0.93 (ref 0.00–1.49)
Prothrombin Time: 12.3 seconds (ref 11.6–15.2)

## 2013-05-24 NOTE — Progress Notes (Signed)
Pt came in to get labs done-very nervous-no health problems

## 2013-05-25 ENCOUNTER — Other Ambulatory Visit (INDEPENDENT_AMBULATORY_CARE_PROVIDER_SITE_OTHER): Payer: Self-pay | Admitting: General Surgery

## 2013-05-25 DIAGNOSIS — D242 Benign neoplasm of left breast: Secondary | ICD-10-CM

## 2013-06-01 ENCOUNTER — Encounter (HOSPITAL_BASED_OUTPATIENT_CLINIC_OR_DEPARTMENT_OTHER)
Admission: RE | Disposition: A | Discharge status: Home / Self Care | Payer: Self-pay | Source: Ambulatory Visit | Attending: General Surgery

## 2013-06-01 ENCOUNTER — Ambulatory Visit
Admit: 2013-06-01 | Discharge: 2013-06-01 | Disposition: A | Discharge status: Home / Self Care | Payer: PRIVATE HEALTH INSURANCE | Attending: General Surgery | Admitting: General Surgery

## 2013-06-01 ENCOUNTER — Encounter (HOSPITAL_BASED_OUTPATIENT_CLINIC_OR_DEPARTMENT_OTHER): Payer: PRIVATE HEALTH INSURANCE | Admitting: Anesthesiology

## 2013-06-01 ENCOUNTER — Ambulatory Visit (HOSPITAL_BASED_OUTPATIENT_CLINIC_OR_DEPARTMENT_OTHER)
Admission: RE | Admit: 2013-06-01 | Discharge: 2013-06-01 | Disposition: A | Discharge status: Home / Self Care | Payer: PRIVATE HEALTH INSURANCE | Source: Ambulatory Visit | Attending: General Surgery | Admitting: General Surgery

## 2013-06-01 ENCOUNTER — Encounter (HOSPITAL_BASED_OUTPATIENT_CLINIC_OR_DEPARTMENT_OTHER): Payer: Self-pay | Admitting: *Deleted

## 2013-06-01 ENCOUNTER — Ambulatory Visit
Admission: RE | Admit: 2013-06-01 | Discharge: 2013-06-01 | Disposition: A | Discharge status: Home / Self Care | Payer: Self-pay | Source: Ambulatory Visit | Attending: General Surgery | Admitting: General Surgery

## 2013-06-01 ENCOUNTER — Ambulatory Visit (HOSPITAL_BASED_OUTPATIENT_CLINIC_OR_DEPARTMENT_OTHER): Payer: PRIVATE HEALTH INSURANCE | Admitting: Anesthesiology

## 2013-06-01 DIAGNOSIS — N938 Other specified abnormal uterine and vaginal bleeding: Secondary | ICD-10-CM | POA: Insufficient documentation

## 2013-06-01 DIAGNOSIS — D249 Benign neoplasm of unspecified breast: Secondary | ICD-10-CM

## 2013-06-01 DIAGNOSIS — D242 Benign neoplasm of left breast: Secondary | ICD-10-CM

## 2013-06-01 DIAGNOSIS — Z6830 Body mass index (BMI) 30.0-30.9, adult: Secondary | ICD-10-CM | POA: Insufficient documentation

## 2013-06-01 DIAGNOSIS — F411 Generalized anxiety disorder: Secondary | ICD-10-CM | POA: Insufficient documentation

## 2013-06-01 DIAGNOSIS — R51 Headache: Secondary | ICD-10-CM | POA: Insufficient documentation

## 2013-06-01 DIAGNOSIS — E669 Obesity, unspecified: Secondary | ICD-10-CM | POA: Insufficient documentation

## 2013-06-01 DIAGNOSIS — N949 Unspecified condition associated with female genital organs and menstrual cycle: Secondary | ICD-10-CM | POA: Insufficient documentation

## 2013-06-01 DIAGNOSIS — K649 Unspecified hemorrhoids: Secondary | ICD-10-CM | POA: Insufficient documentation

## 2013-06-01 DIAGNOSIS — N925 Other specified irregular menstruation: Secondary | ICD-10-CM | POA: Insufficient documentation

## 2013-06-01 HISTORY — PX: BREAST LUMPECTOMY WITH NEEDLE LOCALIZATION: SHX5759

## 2013-06-01 HISTORY — DX: Presence of spectacles and contact lenses: Z97.3

## 2013-06-01 SURGERY — BREAST LUMPECTOMY WITH NEEDLE LOCALIZATION
Anesthesia: General | Site: Breast | Laterality: Left

## 2013-06-01 MED ORDER — ONDANSETRON HCL 4 MG/2ML IJ SOLN
INTRAMUSCULAR | Status: DC | PRN
  Administered 2013-06-01: 4 mg via INTRAVENOUS

## 2013-06-01 MED ORDER — MIDAZOLAM HCL 2 MG/2ML IJ SOLN
INTRAMUSCULAR | Status: AC
  Filled 2013-06-01: qty 2

## 2013-06-01 MED ORDER — PROMETHAZINE HCL 25 MG/ML IJ SOLN: 6.2500 mg | INTRAMUSCULAR | Status: DC | PRN

## 2013-06-01 MED ORDER — LACTATED RINGERS IV SOLN
INTRAVENOUS | Status: DC
  Administered 2013-06-01: 10:00:00 via INTRAVENOUS

## 2013-06-01 MED ORDER — DEXAMETHASONE SODIUM PHOSPHATE 4 MG/ML IJ SOLN
INTRAMUSCULAR | Status: DC | PRN
  Administered 2013-06-01: 10 mg via INTRAVENOUS

## 2013-06-01 MED ORDER — SCOPOLAMINE 1 MG/3DAYS TD PT72
MEDICATED_PATCH | TRANSDERMAL | Status: AC
  Filled 2013-06-01: qty 1

## 2013-06-01 MED ORDER — BUPIVACAINE HCL (PF) 0.5 % IJ SOLN
INTRAMUSCULAR | Status: AC
  Filled 2013-06-01: qty 30

## 2013-06-01 MED ORDER — FENTANYL CITRATE 0.05 MG/ML IJ SOLN
25.0000 ug | INTRAMUSCULAR | Status: DC | PRN
  Administered 2013-06-01: 50 ug via INTRAVENOUS

## 2013-06-01 MED ORDER — LIDOCAINE HCL (PF) 1 % IJ SOLN
INTRAMUSCULAR | Status: AC
  Filled 2013-06-01: qty 30

## 2013-06-01 MED ORDER — OXYCODONE HCL 5 MG PO TABS
5.0000 mg | ORAL_TABLET | Freq: Once | ORAL | Status: AC | PRN
  Administered 2013-06-01: 5 mg via ORAL

## 2013-06-01 MED ORDER — FENTANYL CITRATE 0.05 MG/ML IJ SOLN: 50.0000 ug | INTRAMUSCULAR | Status: DC | PRN

## 2013-06-01 MED ORDER — MIDAZOLAM HCL 2 MG/2ML IJ SOLN: 1.0000 mg | INTRAMUSCULAR | Status: DC | PRN

## 2013-06-01 MED ORDER — BUPIVACAINE HCL (PF) 0.5 % IJ SOLN
INTRAMUSCULAR | Status: DC | PRN
  Administered 2013-06-01: 19 mL

## 2013-06-01 MED ORDER — FENTANYL CITRATE 0.05 MG/ML IJ SOLN
INTRAMUSCULAR | Status: AC
  Filled 2013-06-01: qty 2

## 2013-06-01 MED ORDER — VANCOMYCIN HCL IN DEXTROSE 1-5 GM/200ML-% IV SOLN
INTRAVENOUS | Status: AC
  Filled 2013-06-01: qty 200

## 2013-06-01 MED ORDER — FENTANYL CITRATE 0.05 MG/ML IJ SOLN
INTRAMUSCULAR | Status: DC | PRN
  Administered 2013-06-01: 50 ug via INTRAVENOUS
  Administered 2013-06-01: 100 ug via INTRAVENOUS
  Administered 2013-06-01: 50 ug via INTRAVENOUS

## 2013-06-01 MED ORDER — MIDAZOLAM HCL 5 MG/5ML IJ SOLN
INTRAMUSCULAR | Status: DC | PRN
  Administered 2013-06-01: 2 mg via INTRAVENOUS

## 2013-06-01 MED ORDER — SODIUM BICARBONATE 4 % IV SOLN
INTRAVENOUS | Status: AC
  Filled 2013-06-01: qty 5

## 2013-06-01 MED ORDER — OXYCODONE HCL 5 MG PO TABS
ORAL_TABLET | ORAL | Status: AC
  Filled 2013-06-01: qty 1

## 2013-06-01 MED ORDER — VANCOMYCIN HCL IN DEXTROSE 1-5 GM/200ML-% IV SOLN
1000.0000 mg | INTRAVENOUS | Status: AC
  Administered 2013-06-01: 1000 mg via INTRAVENOUS

## 2013-06-01 MED ORDER — PROPOFOL 10 MG/ML IV BOLUS
INTRAVENOUS | Status: DC | PRN
  Administered 2013-06-01: 200 mg via INTRAVENOUS

## 2013-06-01 MED ORDER — MIDAZOLAM HCL 2 MG/2ML IJ SOLN: 0.5000 mg | Freq: Once | INTRAMUSCULAR | Status: DC | PRN

## 2013-06-01 MED ORDER — SCOPOLAMINE 1 MG/3DAYS TD PT72
1.0000 | MEDICATED_PATCH | TRANSDERMAL | Status: DC
  Administered 2013-06-01: 1 via TRANSDERMAL

## 2013-06-01 MED ORDER — OXYCODONE HCL 5 MG/5ML PO SOLN: 5.0000 mg | Freq: Once | ORAL | Status: AC | PRN

## 2013-06-01 MED ORDER — HYDROCODONE-ACETAMINOPHEN 5-325 MG PO TABS: 1.0000 | ORAL_TABLET | ORAL | Status: DC | PRN

## 2013-06-01 MED ORDER — FENTANYL CITRATE 0.05 MG/ML IJ SOLN
INTRAMUSCULAR | Status: AC
  Filled 2013-06-01: qty 6

## 2013-06-01 MED ORDER — LIDOCAINE HCL (CARDIAC) 20 MG/ML IV SOLN
INTRAVENOUS | Status: DC | PRN
  Administered 2013-06-01: 50 mg via INTRAVENOUS

## 2013-06-01 MED ORDER — MEPERIDINE HCL 25 MG/ML IJ SOLN: 6.2500 mg | INTRAMUSCULAR | Status: DC | PRN

## 2013-06-01 SURGICAL SUPPLY — 44 items
BENZOIN TINCTURE PRP APPL 2/3 (GAUZE/BANDAGES/DRESSINGS) ×2 IMPLANT
BINDER BREAST LRG (GAUZE/BANDAGES/DRESSINGS) ×2 IMPLANT
BINDER BREAST MEDIUM (GAUZE/BANDAGES/DRESSINGS) IMPLANT
BINDER BREAST XLRG (GAUZE/BANDAGES/DRESSINGS) IMPLANT
BINDER BREAST XXLRG (GAUZE/BANDAGES/DRESSINGS) IMPLANT
BLADE SURG 15 STRL LF DISP TIS (BLADE) ×1 IMPLANT
BLADE SURG 15 STRL SS (BLADE) ×1
CANISTER SUCT 1200ML W/VALVE (MISCELLANEOUS) IMPLANT
CHLORAPREP W/TINT 26ML (MISCELLANEOUS) ×2 IMPLANT
COVER MAYO STAND STRL (DRAPES) ×2 IMPLANT
COVER TABLE BACK 60X90 (DRAPES) ×2 IMPLANT
DECANTER SPIKE VIAL GLASS SM (MISCELLANEOUS) ×2 IMPLANT
DEVICE DUBIN W/COMP PLATE 8390 (MISCELLANEOUS) ×2 IMPLANT
DRAPE PED LAPAROTOMY (DRAPES) ×2 IMPLANT
DRAPE UTILITY XL STRL (DRAPES) ×2 IMPLANT
ELECT COATED BLADE 2.86 ST (ELECTRODE) ×2 IMPLANT
ELECT REM PT RETURN 9FT ADLT (ELECTROSURGICAL) ×2
ELECTRODE REM PT RTRN 9FT ADLT (ELECTROSURGICAL) ×1 IMPLANT
GLOVE BIOGEL PI IND STRL 7.0 (GLOVE) ×1 IMPLANT
GLOVE BIOGEL PI IND STRL 8.5 (GLOVE) ×1 IMPLANT
GLOVE BIOGEL PI INDICATOR 7.0 (GLOVE) ×1
GLOVE BIOGEL PI INDICATOR 8.5 (GLOVE) ×1
GLOVE ECLIPSE 8.0 STRL XLNG CF (GLOVE) IMPLANT
GLOVE EXAM NITRILE PF MED BLUE (GLOVE) ×2 IMPLANT
GLOVE SURG SS PI 7.0 STRL IVOR (GLOVE) ×2 IMPLANT
GLOVE SURG SS PI 8.0 STRL IVOR (GLOVE) ×2 IMPLANT
GOWN STRL REUS W/ TWL LRG LVL3 (GOWN DISPOSABLE) ×2 IMPLANT
GOWN STRL REUS W/TWL LRG LVL3 (GOWN DISPOSABLE) ×2
NEEDLE HYPO 25X1 1.5 SAFETY (NEEDLE) ×2 IMPLANT
NS IRRIG 1000ML POUR BTL (IV SOLUTION) ×2 IMPLANT
PACK BASIN DAY SURGERY FS (CUSTOM PROCEDURE TRAY) ×2 IMPLANT
PENCIL BUTTON HOLSTER BLD 10FT (ELECTRODE) ×2 IMPLANT
SLEEVE SCD COMPRESS KNEE MED (MISCELLANEOUS) ×2 IMPLANT
SPONGE GAUZE 4X4 12PLY (GAUZE/BANDAGES/DRESSINGS) ×2 IMPLANT
SPONGE GAUZE 4X4 12PLY STER LF (GAUZE/BANDAGES/DRESSINGS) IMPLANT
STRIP CLOSURE SKIN 1/2X4 (GAUZE/BANDAGES/DRESSINGS) ×2 IMPLANT
SUT MON AB 4-0 PC3 18 (SUTURE) ×2 IMPLANT
SUT SILK 2 0 FS (SUTURE) ×2 IMPLANT
SUT VICRYL 3-0 CR8 SH (SUTURE) ×2 IMPLANT
SYR CONTROL 10ML LL (SYRINGE) ×2 IMPLANT
TOWEL OR 17X24 6PK STRL BLUE (TOWEL DISPOSABLE) ×2 IMPLANT
TOWEL OR NON WOVEN STRL DISP B (DISPOSABLE) ×2 IMPLANT
TUBE CONNECTING 20X1/4 (TUBING) IMPLANT
YANKAUER SUCT BULB TIP NO VENT (SUCTIONS) IMPLANT

## 2013-06-01 NOTE — Anesthesia Preprocedure Evaluation (Signed)
Anesthesia Evaluation  Patient identified by MRN, date of birth, ID band Patient awake    Reviewed: Allergy & Precautions, H&P , NPO status , Patient's Chart, lab work & pertinent test results  History of Anesthesia Complications (+) PONV and history of anesthetic complications  Airway Mallampati: I TM Distance: >3 FB Neck ROM: Full    Dental  (+) Teeth Intact, Dental Advisory Given   Pulmonary neg pulmonary ROS,  breath sounds clear to auscultation  Pulmonary exam normal       Cardiovascular negative cardio ROS  Rhythm:Regular Rate:Normal     Neuro/Psych Seizures - (childhood), Well Controlled,     GI/Hepatic negative GI ROS, Neg liver ROS,   Endo/Other  Morbid obesity  Renal/GU negative Renal ROS     Musculoskeletal   Abdominal (+) + obese,   Peds  Hematology   Anesthesia Other Findings   Reproductive/Obstetrics                           Anesthesia Physical Anesthesia Plan  ASA: II  Anesthesia Plan: General   Post-op Pain Management:    Induction: Intravenous  Airway Management Planned: LMA  Additional Equipment:   Intra-op Plan:   Post-operative Plan:   Informed Consent: I have reviewed the patients History and Physical, chart, labs and discussed the procedure including the risks, benefits and alternatives for the proposed anesthesia with the patient or authorized representative who has indicated his/her understanding and acceptance.   Dental advisory given  Plan Discussed with: CRNA and Surgeon  Anesthesia Plan Comments: (Plan routine monitors, GA- LMA OK)        Anesthesia Quick Evaluation

## 2013-06-01 NOTE — Discharge Instructions (Addendum)
[place] Office Phone Number [phone]  BREAST BIOPSY/ PARTIAL MASTECTOMY: POST OP INSTRUCTIONS  Always review your discharge instruction sheet given to you by the facility where your surgery was performed.  IF YOU HAVE DISABILITY OR FAMILY LEAVE FORMS, YOU MUST BRING THEM TO THE OFFICE FOR PROCESSING.  DO NOT GIVE THEM TO YOUR DOCTOR.  1. A prescription for pain medication may be given to you upon discharge.  Take your pain medication as prescribed, if needed.  If narcotic pain medicine is not needed, then you may take acetaminophen (Tylenol) or ibuprofen (Advil) as needed. 2. Take your usually prescribed medications unless otherwise directed 3. If you need a refill on your pain medication, please contact your pharmacy.  They will contact our office to request authorization.  Prescriptions will not be filled after 5pm or on week-ends. 4. You should eat very light the first 24 hours after surgery, such as soup, crackers, pudding, etc.  Resume your normal diet the day after surgery. 5. Most patients will experience some swelling and bruising in the breast.  Ice packs and a good support bra will help.  Swelling and bruising can take several days to resolve.  6. It is common to experience some constipation if taking pain medication after surgery.  Increasing fluid intake and taking a stool softener will usually help or prevent this problem from occurring.  A mild laxative (Milk of Magnesia or Miralax) should be taken according to package directions if there are no bowel movements after 48 hours. 7. Unless discharge instructions indicate otherwise, you may remove your bandages 48 hours after surgery, and you may shower at that time.  You may have steri-strips (small skin tapes) in place directly over the incision.  These strips should be left on the skin.  If your surgeon used skin glue on the incision, you may shower in 24 hours.  The glue will flake off over the next 2-3 weeks.   Any sutures or staples will be removed at the office during your follow-up visit. 8. ACTIVITIES:  You may resume regular daily activities (gradually increasing) beginning the next day.  Wearing a good support bra or sports bra minimizes pain and swelling.  You may have sexual intercourse when it is comfortable. a. You may drive when you no longer are taking prescription pain medication, you can comfortably wear a seatbelt, and you can safely maneuver your car and apply brakes. b. RETURN TO WORK:  _Light work in 2-3 days if comfortable. Resume normal activities when pain-free._____________________________________________________________________________________ You should see your doctor in the office for a follow-up appointment approximately two weeks after your surgery.  Please call and make this appointment.  Expect your pathology report 3 business days after your surgery.  You may call to check if you do not hear from [place] after three days. 9. OTHER INSTRUCTIONS: _______________________________________________________________________________________________ _____________________________________________________________________________________________________________________________________ _____________________________________________________________________________________________________________________________________ _____________________________________________________________________________________________________________________________________  WHEN TO CALL YOUR DOCTOR: 1. Fever over 101.0 2. Nausea and/or vomiting. 3. Extreme swelling or bruising. 4. Continued bleeding from incision. 5. Increased pain, redness, or drainage from the incision.  The clinic staff is available to answer your questions during regular business hours.  Please dont hesitate to call and ask to speak to one of the nurses for clinical concerns.  If you have a medical emergency, go to the nearest emergency room or call  911.  A surgeon from [facility] is always on call at the hospital.  For further questions, please visit [email or web address]    Post Anesthesia  Home Care Instructions  Activity: Get plenty of rest for the remainder of the day. A responsible adult should stay with you for 24 hours following the procedure.  For the next 24 hours, DO NOT: -Drive a car -Paediatric nurse -Drink alcoholic beverages -Take any medication unless instructed by your physician -Make any legal decisions or sign important papers.  Meals: Start with liquid foods such as gelatin or soup. Progress to regular foods as tolerated. Avoid greasy, spicy, heavy foods. If nausea and/or vomiting occur, drink only clear liquids until the nausea and/or vomiting subsides. Call your physician if vomiting continues.  Special Instructions/Symptoms: Your throat may feel dry or sore from the anesthesia or the breathing tube placed in your throat during surgery. If this causes discomfort, gargle with warm salt water. The discomfort should disappear within 24 hours.

## 2013-06-01 NOTE — Anesthesia Procedure Notes (Signed)
Procedure Name: LMA Insertion Date/Time: 06/01/2013 10:56 AM Performed by: Melynda Ripple D Pre-anesthesia Checklist: Patient identified, Emergency Drugs available, Suction available and Patient being monitored Patient Re-evaluated:Patient Re-evaluated prior to inductionOxygen Delivery Method: Circle System Utilized Preoxygenation: Pre-oxygenation with 100% oxygen Intubation Type: IV induction Ventilation: Mask ventilation without difficulty LMA: LMA inserted LMA Size: 4.0 Number of attempts: 1 Airway Equipment and Method: bite block Placement Confirmation: positive ETCO2 Tube secured with: Tape Dental Injury: Teeth and Oropharynx as per pre-operative assessment

## 2013-06-01 NOTE — Transfer of Care (Signed)
Immediate Anesthesia Transfer of Care Note  Patient: Erin Travis  Procedure(s) Performed: Procedure(s): BREAST LUMPECTOMY WITH NEEDLE LOCALIZATION (Left)  Patient Location: PACU  Anesthesia Type:General  Level of Consciousness: awake, alert  and oriented  Airway & Oxygen Therapy: Patient Spontanous Breathing and Patient connected to face mask oxygen  Post-op Assessment: Report given to PACU RN and Post -op Vital signs reviewed and stable  Post vital signs: Reviewed and stable  Complications: No apparent anesthesia complications

## 2013-06-01 NOTE — Progress Notes (Signed)
Patient ID: Erin Travis, female   DOB: 1965-08-18, 48 y.o.   MRN: [id number]  No chief complaint on file.   HPI Erin Travis is a 48 y.o. female.   HPI  She is referred by Dr. 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 She underwent a mammogram which demonstrated a lesion in the left breast at the 11:00 position. Image guided biopsy demonstrated the above pathology. Wider excision has been recommended to fully characterize the lesion.  No palpable breast masses. No nipple discharge. No family history of breast cancer. Menarche at age 45. She's had a hysterectomy in the past. Age at first live birth was less than 23.   Past Medical History  Diagnosis Date  . DUB (dysfunctional uterine bleeding)   . PONV (postoperative nausea and vomiting)   . Hemorrhoids   . Headache(784.0)   . Seizures     caused by fever as child - no seizures since childhood, no meds  . Anxiety     no meds  . Obesity (BMI 30-39.9)     BMI 30  . Wears glasses     Past Surgical History  Procedure Laterality Date  . Tubal ligation  07/2005  . Laparoscopy  11/2004  . Tonsillectomy and adenoidectomy    . Wisdom tooth extraction    . Multiple tooth extractions    . Svd      x 2  . Cystoscopy  04/22/2011    Procedure: CYSTOSCOPY;  Surgeon: Myra C. Hulan Fray, MD;  Location: [facility];  Service: Gynecology;  Laterality: N/A;  . Abdominal hysterectomy    . Abdominal hysterectomy      Family History  Problem Relation Age of Onset  . Cancer Father     throat  . Lung disease Mother   . Cancer Paternal Grandmother     stomach    Social History History  Substance Use Topics  . Smoking status: Never Smoker   . Smokeless tobacco: Never Used  . Alcohol Use: No    Allergies  Allergen Reactions  . Latex Itching    Vaginal itching and irritation  . Penicillins Itching    Current Facility-Administered Medications  Medication Dose Route Frequency Provider  Last Rate Last Dose  . fentaNYL (SUBLIMAZE) injection 50-100 mcg  50-100 mcg Intravenous PRN Napoleon Form, MD      . lactated ringers infusion   Intravenous Continuous Napoleon Form, MD      . midazolam (VERSED) injection 1-2 mg  1-2 mg Intravenous PRN Napoleon Form, MD      . scopolamine (TRANSDERM-SCOP) 1 MG/3DAYS 1.5 mg  1 patch Transdermal Q72H E. Annye Asa, MD   1 patch at 06/01/13 1037  . vancomycin (VANCOCIN) IVPB 1000 mg/200 mL premix  1,000 mg Intravenous On Call to OR Odis Hollingshead, MD        Review of Systems Review of Systems  Constitutional: Negative.   HENT: Negative.   Respiratory: Negative.   Cardiovascular: Negative.   Gastrointestinal: Negative.   Genitourinary: Negative.   Hematological: Negative.     Blood pressure 138/93, pulse 70, temperature 98.6 F (37 C), temperature source Oral, resp. rate 20, height 5\' 7"  (1.702 m), weight 201 lb 8 oz (91.4 kg), last menstrual period 04/17/2011, SpO2 100.00%.  Physical Exam Physical Exam  Constitutional: She appears well-developed and well-nourished. No distress.  HENT:  Head: Normocephalic and atraumatic.  Eyes: No scleral icterus.  Neck: Neck  supple.  Cardiovascular: Normal rate and regular rhythm.   Pulmonary/Chest: Effort normal and breath sounds normal.  Breasts are symmetrical in size. Right breast demonstrates no dominant masses or suspicious skin changes. Left breast demonstrates some bruising in the medial aspect. No palpable masses.  Musculoskeletal:  No supraclavicular or axillary adenopathy.  Lymphadenopathy:    She has no cervical adenopathy.  Neurological: She is alert.  Skin: Skin is warm.  Psychiatric: She has a normal mood and affect. Her behavior is normal.    Data Reviewed Mammogram and ultrasound report.Pathology report.  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 Wider excision is recommended.     Plan     Left breast lumpectomy after needle localization.  I have explained the procedure, risks, and aftercare to her.  Risks include but are not limited to bleeding, infection, wound problems, seroma formation, anesthesia.  She seems to understand and agrees with the plan.        Cloys Vera J 06/01/2013, 10:41 AM

## 2013-06-01 NOTE — Anesthesia Postprocedure Evaluation (Signed)
  Anesthesia Post-op Note  Patient: Erin Travis  Procedure(s) Performed: Procedure(s): BREAST LUMPECTOMY WITH NEEDLE LOCALIZATION (Left)  Patient Location: PACU  Anesthesia Type:General  Level of Consciousness: awake, alert  and oriented  Airway and Oxygen Therapy: Patient Spontanous Breathing  Post-op Pain: mild  Post-op Assessment: Post-op Vital signs reviewed  Post-op Vital Signs: Reviewed  Complications: No apparent anesthesia complications

## 2013-06-01 NOTE — Op Note (Signed)
Operative Note  SELMA MINK female 48 y.o. 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 Wider excision was recommended and now she presents for that.    Procedure Detail:  She underwent successful wire localization. She was seen in the holding area and the left breast is marked my initials.  She was brought to the operating room placed supine on the operating table and a general anesthetic was given. The bandage on the left breast was removed and the wire was clipped close to the skin. The left breast and wire were sterilely prepped and draped.  A circumareolar incision was made from the 9 Position to approximately the 2:00 position in the left breast. The skin and dermis were divided sharply. Skin flaps were then raised with electrocautery and the wire was brought into the wound. A lump of tissue was then removed from the left breast. The ends  of the wire was noted and a firm discrete mass was noted close to the tip of the wire. Once I had removed the mass and surrounding tissue with it a specimen mammogram was performed. The lesion was noted in the specimen but appeared to be close to the superior and medial borders. This was confirmed with the radiologist. Subsequently, I took more tissue from the superior and medial areas and oriented this. The original specimen was also oriented with silk suture.The specimens were then sent to pathology.  The wound was inspected and anesthetized with half percent plain Marcaine solution. Bleeding points were  controlled with electrocautery. Once hemostasis was adequate, the subcutaneous tissue was closed with interrupted 3-0 Vicryl sutures. The skin was closed with a running 4-0 Monocryl subcuticular stitch. Steri-Strips and sterile dressings were applied.  She tolerated the procedure well without any apparent complications and was taken to the recovery room in satisfactory condition.  Estimated Blood Loss:  100 cc         Drains: none  Blood Given: none          Specimens: Left breast tissue        Complications:  * No complications entered in OR log *         Disposition: PACU - hemodynamically stable.         Condition: stable

## 2013-06-01 NOTE — Interval H&P Note (Signed)
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 After consideration of risks, benefits and other options for treatment, the patient has consented to  Procedure(s): BREAST LUMPECTOMY WITH NEEDLE LOCALIZATION (Left) as a surgical intervention .  The patient's history has been reviewed, patient examined, no change in status, stable for surgery.  I have reviewed the patient's chart and labs.  Questions were answered to the patient's satisfaction.     Carisa Backhaus [name]

## 2013-06-01 NOTE — H&P (View-Only) (Signed)
Patient ID: Erin Travis, female   DOB: 1965-08-18, 48 y.o.   MRN: [id number]  No chief complaint on file.   HPI Erin Travis is a 48 y.o. female.   HPI  She is referred by Dr. 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 She underwent a mammogram which demonstrated a lesion in the left breast at the 11:00 position. Image guided biopsy demonstrated the above pathology. Wider excision has been recommended to fully characterize the lesion.  No palpable breast masses. No nipple discharge. No family history of breast cancer. Menarche at age 45. She's had a hysterectomy in the past. Age at first live birth was less than 23.   Past Medical History  Diagnosis Date  . DUB (dysfunctional uterine bleeding)   . PONV (postoperative nausea and vomiting)   . Hemorrhoids   . Headache(784.0)   . Seizures     caused by fever as child - no seizures since childhood, no meds  . Anxiety     no meds  . Obesity (BMI 30-39.9)     BMI 30  . Wears glasses     Past Surgical History  Procedure Laterality Date  . Tubal ligation  07/2005  . Laparoscopy  11/2004  . Tonsillectomy and adenoidectomy    . Wisdom tooth extraction    . Multiple tooth extractions    . Svd      x 2  . Cystoscopy  04/22/2011    Procedure: CYSTOSCOPY;  Surgeon: Myra C. Hulan Fray, MD;  Location: [facility];  Service: Gynecology;  Laterality: N/A;  . Abdominal hysterectomy    . Abdominal hysterectomy      Family History  Problem Relation Age of Onset  . Cancer Father     throat  . Lung disease Mother   . Cancer Paternal Grandmother     stomach    Social History History  Substance Use Topics  . Smoking status: Never Smoker   . Smokeless tobacco: Never Used  . Alcohol Use: No    Allergies  Allergen Reactions  . Latex Itching    Vaginal itching and irritation  . Penicillins Itching    Current Facility-Administered Medications  Medication Dose Route Frequency Provider  Last Rate Last Dose  . fentaNYL (SUBLIMAZE) injection 50-100 mcg  50-100 mcg Intravenous PRN Napoleon Form, MD      . lactated ringers infusion   Intravenous Continuous Napoleon Form, MD      . midazolam (VERSED) injection 1-2 mg  1-2 mg Intravenous PRN Napoleon Form, MD      . scopolamine (TRANSDERM-SCOP) 1 MG/3DAYS 1.5 mg  1 patch Transdermal Q72H E. Annye Asa, MD   1 patch at 06/01/13 1037  . vancomycin (VANCOCIN) IVPB 1000 mg/200 mL premix  1,000 mg Intravenous On Call to OR Odis Hollingshead, MD        Review of Systems Review of Systems  Constitutional: Negative.   HENT: Negative.   Respiratory: Negative.   Cardiovascular: Negative.   Gastrointestinal: Negative.   Genitourinary: Negative.   Hematological: Negative.     Blood pressure 138/93, pulse 70, temperature 98.6 F (37 C), temperature source Oral, resp. rate 20, height 5\' 7"  (1.702 m), weight 201 lb 8 oz (91.4 kg), last menstrual period 04/17/2011, SpO2 100.00%.  Physical Exam Physical Exam  Constitutional: She appears well-developed and well-nourished. No distress.  HENT:  Head: Normocephalic and atraumatic.  Eyes: No scleral icterus.  Neck: Neck  supple.  Cardiovascular: Normal rate and regular rhythm.   Pulmonary/Chest: Effort normal and breath sounds normal.  Breasts are symmetrical in size. Right breast demonstrates no dominant masses or suspicious skin changes. Left breast demonstrates some bruising in the medial aspect. No palpable masses.  Musculoskeletal:  No supraclavicular or axillary adenopathy.  Lymphadenopathy:    She has no cervical adenopathy.  Neurological: She is alert.  Skin: Skin is warm.  Psychiatric: She has a normal mood and affect. Her behavior is normal.    Data Reviewed Mammogram and ultrasound report.Pathology report.  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 Wider excision is recommended.     Plan     Left breast lumpectomy after needle localization.  I have explained the procedure, risks, and aftercare to her.  Risks include but are not limited to bleeding, infection, wound problems, seroma formation, anesthesia.  She seems to understand and agrees with the plan.        Denyce Harr J 06/01/2013, 10:41 AM

## 2013-06-03 ENCOUNTER — Encounter (HOSPITAL_BASED_OUTPATIENT_CLINIC_OR_DEPARTMENT_OTHER): Payer: Self-pay | Admitting: General Surgery

## 2013-06-04 ENCOUNTER — Telehealth (INDEPENDENT_AMBULATORY_CARE_PROVIDER_SITE_OTHER): Payer: Self-pay

## 2013-06-04 NOTE — Telephone Encounter (Signed)
Pt made aware pathology showed benign fibroadenoma.

## 2013-06-16 ENCOUNTER — Encounter (INDEPENDENT_AMBULATORY_CARE_PROVIDER_SITE_OTHER): Payer: PRIVATE HEALTH INSURANCE | Admitting: General Surgery

## 2013-06-25 ENCOUNTER — Encounter (INDEPENDENT_AMBULATORY_CARE_PROVIDER_SITE_OTHER): Payer: PRIVATE HEALTH INSURANCE | Admitting: General Surgery

## 2013-07-01 ENCOUNTER — Encounter (INDEPENDENT_AMBULATORY_CARE_PROVIDER_SITE_OTHER): Payer: PRIVATE HEALTH INSURANCE | Admitting: General Surgery

## 2013-07-08 ENCOUNTER — Encounter (INDEPENDENT_AMBULATORY_CARE_PROVIDER_SITE_OTHER): Payer: Self-pay | Admitting: General Surgery

## 2013-10-27 ENCOUNTER — Encounter (HOSPITAL_COMMUNITY): Payer: Self-pay | Admitting: Pediatrics

## 2013-10-27 ENCOUNTER — Inpatient Hospital Stay (HOSPITAL_COMMUNITY)
Admission: AD | Admit: 2013-10-27 | Discharge: 2013-10-27 | Disposition: A | Discharge status: Home / Self Care | Payer: PRIVATE HEALTH INSURANCE | Source: Ambulatory Visit | Attending: Obstetrics & Gynecology | Admitting: Obstetrics & Gynecology

## 2013-10-27 DIAGNOSIS — Z90710 Acquired absence of both cervix and uterus: Secondary | ICD-10-CM | POA: Diagnosis not present

## 2013-10-27 DIAGNOSIS — R1032 Left lower quadrant pain: Secondary | ICD-10-CM | POA: Insufficient documentation

## 2013-10-27 DIAGNOSIS — B9689 Other specified bacterial agents as the cause of diseases classified elsewhere: Secondary | ICD-10-CM | POA: Diagnosis not present

## 2013-10-27 DIAGNOSIS — Z880 Allergy status to penicillin: Secondary | ICD-10-CM | POA: Insufficient documentation

## 2013-10-27 DIAGNOSIS — A499 Bacterial infection, unspecified: Secondary | ICD-10-CM | POA: Insufficient documentation

## 2013-10-27 DIAGNOSIS — N760 Acute vaginitis: Secondary | ICD-10-CM | POA: Diagnosis not present

## 2013-10-27 DIAGNOSIS — N898 Other specified noninflammatory disorders of vagina: Secondary | ICD-10-CM | POA: Diagnosis present

## 2013-10-27 LAB — URINALYSIS, ROUTINE W REFLEX MICROSCOPIC
Bilirubin Urine: NEGATIVE
Glucose, UA: NEGATIVE mg/dL
Hgb urine dipstick: NEGATIVE
Ketones, ur: NEGATIVE mg/dL
Leukocytes, UA: NEGATIVE
Nitrite: NEGATIVE
Protein, ur: NEGATIVE mg/dL
Specific Gravity, Urine: >1.03 — ABNORMAL HIGH (ref 1.005–1.030)
Urobilinogen, UA: 0.2 mg/dL (ref 0.0–1.0)
pH: 5.5 (ref 5.0–8.0)

## 2013-10-27 LAB — WET PREP, GENITAL
Trich, Wet Prep: NONE SEEN
Yeast Wet Prep HPF POC: NONE SEEN

## 2013-10-27 MED ORDER — METRONIDAZOLE 500 MG PO TABS: 500.0000 mg | ORAL_TABLET | Freq: Two times a day (BID) | ORAL | Status: DC

## 2013-10-27 NOTE — MAU Note (Signed)
Pt. States she had intercourse with her ex husband 2 days ago.  Since then she has had a yellow discharge that has a very bad odor and some spotting.

## 2013-10-27 NOTE — MAU Note (Signed)
Pt reports yellow vaginal discharge with odor for the last 2 days, also had pinkish discharge on tissue today. States she had a hysterectomy.

## 2013-10-27 NOTE — MAU Provider Note (Signed)
History     CSN: [id number]  Arrival date and time: 10/27/13 1940   First Provider Initiated Contact with Patient 10/27/13 2224      Chief Complaint  Patient presents with  . Vaginal Discharge  . Abdominal Pain   HPI Ms. Erin Travis is a 48 y.o. G2P2 who presents to MAU today with complaint of vaginal discharge with odor. The patient states that she is separated from her husband and he has a new partner, but he came over on the weekend and they had intercourse. She states that about 2 days ago she developed a yellow discharge with foul odor. She also notes possible scant spotting. She states LLQ pain off and on which is common for her with a history of endometriosis. She denies pain now. She denies fever, N/V or UTI symptoms. She has had a hysterectomy.   OB History   Grav Para Term Preterm Abortions TAB SAB Ect Mult Living   2 2        2       Past Medical History  Diagnosis Date  . DUB (dysfunctional uterine bleeding)   . PONV (postoperative nausea and vomiting)   . Hemorrhoids   . Headache(784.0)   . Seizures     caused by fever as child - no seizures since childhood, no meds  . Anxiety     no meds  . Obesity (BMI 30-39.9)     BMI 30  . Wears glasses     Past Surgical History  Procedure Laterality Date  . Tubal ligation  07/2005  . Laparoscopy  11/2004  . Tonsillectomy and adenoidectomy    . Wisdom tooth extraction    . Multiple tooth extractions    . Svd      x 2  . Cystoscopy  04/22/2011    Procedure: CYSTOSCOPY;  Surgeon: Myra C. Hulan Fray, MD;  Location: [facility];  Service: Gynecology;  Laterality: N/A;  . Abdominal hysterectomy    . Abdominal hysterectomy    . Breast lumpectomy with needle localization Left 06/01/2013    Procedure: BREAST LUMPECTOMY WITH NEEDLE LOCALIZATION;  Surgeon: Odis Hollingshead, MD;  Location: [facility];  Service: General;  Laterality: Left;    Family History  Problem Relation Age of Onset  . Cancer Father     throat   . Lung disease Mother   . Cancer Paternal Grandmother     stomach  . Hypertension Brother     History  Substance Use Topics  . Smoking status: Never Smoker   . Smokeless tobacco: Never Used  . Alcohol Use: No    Allergies:  Allergies  Allergen Reactions  . Latex Itching    Vaginal itching and irritation  . Penicillins Itching    Prescriptions prior to admission  Medication Sig Dispense Refill  . aspirin-acetaminophen-caffeine (EXCEDRIN MIGRAINE) 250-250-65 MG per tablet Take by mouth every 6 (six) hours as needed for headache.      Marland Kitchen HYDROcodone-acetaminophen (NORCO/VICODIN) 5-325 MG per tablet Take 1-2 tablets by mouth every 4 (four) hours as needed.  30 tablet  0    Review of Systems  Constitutional: Negative for fever and malaise/fatigue.  Gastrointestinal: Positive for abdominal pain. Negative for nausea and vomiting.  Genitourinary: Negative for dysuria, urgency and frequency.       + vaginal discharge, bleeding   Physical Exam   Blood pressure 137/77, pulse 91, temperature 99.2 F (37.3 C), temperature source Oral, resp. rate 18, height 5\' 7"  (1.702  m), weight 202 lb (91.627 kg), last menstrual period 04/17/2011, SpO2 100.00%.  Physical Exam  Constitutional: She is oriented to person, place, and time. She appears well-developed and well-nourished. No distress.  Cardiovascular: Normal rate.   Respiratory: Effort normal.  GI: Soft. She exhibits no distension and no mass. There is no tenderness. There is no rebound and no guarding.  Genitourinary: Cervix exhibits no motion tenderness, no discharge and no friability. Right adnexum displays no mass and no tenderness. Left adnexum displays no mass and no tenderness. No bleeding around the vagina. Vaginal discharge (scant, thin, white-yellow discharge) found.  Neurological: She is alert and oriented to person, place, and time.  Skin: Skin is warm and dry. No erythema.  Psychiatric: She has a normal mood and affect.    Results for orders placed during the hospital encounter of 10/27/13 (from the past 24 hour(s))  WET PREP, GENITAL     Status: Abnormal   Collection Time    10/27/13 10:45 PM      Result Value Ref Range   Yeast Wet Prep HPF POC NONE SEEN  NONE SEEN   Trich, Wet Prep NONE SEEN  NONE SEEN   Clue Cells Wet Prep HPF POC FEW (*) NONE SEEN   WBC, Wet Prep HPF POC FEW (*) NONE SEEN     MAU Course  Procedures None  MDM UA, wet prep and GC/chlamydia today  Assessment and Plan  A: Bacterial vaginosis  P: Discharge home Rx for Flagyl given to patient GC/Chlamydia pending Patient may return to MAU as needed or if her condition were to change or worsen   Luvenia Redden, PA-C  10/27/2013, 11:35 PM

## 2013-10-27 NOTE — Discharge Instructions (Signed)
Bacterial Vaginosis Bacterial vaginosis is a vaginal infection that occurs when the normal balance of bacteria in the vagina is disrupted. It results from an overgrowth of certain bacteria. This is the most common vaginal infection in women of childbearing age. Treatment is important to prevent complications, especially in pregnant women, as it can cause a premature delivery. CAUSES  Bacterial vaginosis is caused by an increase in harmful bacteria that are normally present in smaller amounts in the vagina. Several different kinds of bacteria can cause bacterial vaginosis. However, the reason that the condition develops is not fully understood. RISK FACTORS Certain activities or behaviors can put you at an increased risk of developing bacterial vaginosis, including:  Having a new sex partner or multiple sex partners.  Douching.  Using an intrauterine device (IUD) for contraception. Women do not get bacterial vaginosis from toilet seats, bedding, swimming pools, or contact with objects around them. SIGNS AND SYMPTOMS  Some women with bacterial vaginosis have no signs or symptoms. Common symptoms include:  Grey vaginal discharge.  A fishlike odor with discharge, especially after sexual intercourse.  Itching or burning of the vagina and vulva.  Burning or pain with urination. DIAGNOSIS  Your health care provider will take a medical history and examine the vagina for signs of bacterial vaginosis. A sample of vaginal fluid may be taken. Your health care provider will look at this sample under a microscope to check for bacteria and abnormal cells. A vaginal pH test may also be done.  TREATMENT  Bacterial vaginosis may be treated with antibiotic medicines. These may be given in the form of a pill or a vaginal cream. A second round of antibiotics may be prescribed if the condition comes back after treatment.  HOME CARE INSTRUCTIONS   Only take over-the-counter or prescription medicines as  directed by your health care provider.  If antibiotic medicine was prescribed, take it as directed. Make sure you finish it even if you start to feel better.  Do not have sex until treatment is completed.  Tell all sexual partners that you have a vaginal infection. They should see their health care provider and be treated if they have problems, such as a mild rash or itching.  Practice safe sex by using condoms and only having one sex partner. SEEK MEDICAL CARE IF:   Your symptoms are not improving after 3 days of treatment.  You have increased discharge or pain.  You have a fever. MAKE SURE YOU:   Understand these instructions.  Will watch your condition.  Will get help right away if you are not doing well or get worse. FOR MORE INFORMATION  Centers for Disease Control and Prevention, Division of STD Prevention: www.cdc.gov/std American Sexual Health Association (ASHA): www.ashastd.org  Document Released: 02/11/2005 Document Revised: 12/02/2012 Document Reviewed: 09/23/2012 ExitCare Patient Information 2015 [employer]. This information is not intended to replace advice given to you by your health care provider. Make sure you discuss any questions you have with your health care provider.  

## 2013-10-28 LAB — GC/CHLAMYDIA PROBE AMP
CT Probe RNA: NEGATIVE
GC Probe RNA: NEGATIVE

## 2013-12-27 ENCOUNTER — Encounter (HOSPITAL_COMMUNITY): Payer: Self-pay | Admitting: Pediatrics

## 2014-04-13 ENCOUNTER — Encounter (HOSPITAL_COMMUNITY): Payer: Self-pay | Admitting: *Deleted

## 2014-04-13 ENCOUNTER — Emergency Department (HOSPITAL_COMMUNITY)
Admission: EM | Admit: 2014-04-13 | Discharge: 2014-04-13 | Disposition: A | Discharge status: Home / Self Care | Payer: No Typology Code available for payment source | Attending: Emergency Medicine | Admitting: Emergency Medicine

## 2014-04-13 DIAGNOSIS — Y998 Other external cause status: Secondary | ICD-10-CM | POA: Diagnosis not present

## 2014-04-13 DIAGNOSIS — Z8659 Personal history of other mental and behavioral disorders: Secondary | ICD-10-CM | POA: Diagnosis not present

## 2014-04-13 DIAGNOSIS — Y92410 Unspecified street and highway as the place of occurrence of the external cause: Secondary | ICD-10-CM | POA: Insufficient documentation

## 2014-04-13 DIAGNOSIS — E669 Obesity, unspecified: Secondary | ICD-10-CM | POA: Diagnosis not present

## 2014-04-13 DIAGNOSIS — S161XXA Strain of muscle, fascia and tendon at neck level, initial encounter: Secondary | ICD-10-CM

## 2014-04-13 DIAGNOSIS — Z792 Long term (current) use of antibiotics: Secondary | ICD-10-CM | POA: Insufficient documentation

## 2014-04-13 DIAGNOSIS — Z7982 Long term (current) use of aspirin: Secondary | ICD-10-CM | POA: Diagnosis not present

## 2014-04-13 DIAGNOSIS — Z880 Allergy status to penicillin: Secondary | ICD-10-CM | POA: Diagnosis not present

## 2014-04-13 DIAGNOSIS — Z91040 Latex allergy status: Secondary | ICD-10-CM | POA: Diagnosis not present

## 2014-04-13 DIAGNOSIS — Y9389 Activity, other specified: Secondary | ICD-10-CM | POA: Insufficient documentation

## 2014-04-13 DIAGNOSIS — S199XXA Unspecified injury of neck, initial encounter: Secondary | ICD-10-CM | POA: Diagnosis present

## 2014-04-13 MED ORDER — METHOCARBAMOL 500 MG PO TABS: 1000.0000 mg | ORAL_TABLET | Freq: Four times a day (QID) | ORAL | Status: DC | PRN
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
## 2014-04-13 NOTE — Discharge Instructions (Signed)
For pain control you may take up to 800mg  of Motrin (also known as ibuprofen). That is usually 4 over the counter pills,  3 times a day. Take with food to minimize stomach irritation   For breakthrough pain you may take Robaxin. Do not drink alcohol, drive or operate heavy machinery when taking Robaxin.  Please follow with your primary care doctor in the next 2 days for a check-up. They must obtain records for further management.   Do not hesitate to return to the Emergency Department for any new, worsening or concerning symptoms.    Cervical Sprain A cervical sprain is an injury in the neck in which the strong, fibrous tissues (ligaments) that connect your neck bones stretch or tear. Cervical sprains can range from mild to severe. Severe cervical sprains can cause the neck vertebrae to be unstable. This can lead to damage of the spinal cord and can result in serious nervous system problems. The amount of time it takes for a cervical sprain to get better depends on the cause and extent of the injury. Most cervical sprains heal in 1 to 3 weeks. CAUSES  Severe cervical sprains may be caused by:   Contact sport injuries (such as from football, rugby, wrestling, hockey, auto racing, gymnastics, diving, martial arts, or boxing).   Motor vehicle collisions.   Whiplash injuries. This is an injury from a sudden forward and backward whipping movement of the head and neck.  Falls.  Mild cervical sprains may be caused by:   Being in an awkward position, such as while cradling a telephone between your ear and shoulder.   Sitting in a chair that does not offer proper support.   Working at a poorly [employer] station.   Looking up or down for long periods of time.  SYMPTOMS   Pain, soreness, stiffness, or a burning sensation in the front, back, or sides of the neck. This discomfort may develop immediately after the injury or slowly, 24 hours or more after the injury.   Pain or  tenderness directly in the middle of the back of the neck.   Shoulder or upper back pain.   Limited ability to move the neck.   Headache.   Dizziness.   Weakness, numbness, or tingling in the hands or arms.   Muscle spasms.   Difficulty swallowing or chewing.   Tenderness and swelling of the neck.  DIAGNOSIS  Most of the time your health care provider can diagnose a cervical sprain by taking your history and doing a physical exam. Your health care provider will ask about previous neck injuries and any known neck problems, such as arthritis in the neck. X-rays may be taken to find out if there are any other problems, such as with the bones of the neck. Other tests, such as a CT scan or MRI, may also be needed.  TREATMENT  Treatment depends on the severity of the cervical sprain. Mild sprains can be treated with rest, keeping the neck in place (immobilization), and pain medicines. Severe cervical sprains are immediately immobilized. Further treatment is done to help with pain, muscle spasms, and other symptoms and may include:  Medicines, such as pain relievers, numbing medicines, or muscle relaxants.   Physical therapy. This may involve stretching exercises, strengthening exercises, and posture training. Exercises and improved posture can help stabilize the neck, strengthen muscles, and help stop symptoms from returning.  HOME CARE INSTRUCTIONS   Put ice on the injured area.   Put ice in  a plastic bag.   Place a towel between your skin and the bag.   Leave the ice on for 15-20 minutes, 3-4 times a day.   If your injury was severe, you may have been given a cervical collar to wear. A cervical collar is a two-piece collar designed to keep your neck from moving while it heals.  Do not remove the collar unless instructed by your health care provider.  If you have long hair, keep it outside of the collar.  Ask your health care provider before making any adjustments  to your collar. Minor adjustments may be required over time to improve comfort and reduce pressure on your chin or on the back of your head.  Ifyou are allowed to remove the collar for cleaning or bathing, follow your health care provider's instructions on how to do so safely.  Keep your collar clean by wiping it with mild soap and water and drying it completely. If the collar you have been given includes removable pads, remove them every 1-2 days and hand wash them with soap and water. Allow them to air dry. They should be completely dry before you wear them in the collar.  If you are allowed to remove the collar for cleaning and bathing, wash and dry the skin of your neck. Check your skin for irritation or sores. If you see any, tell your health care provider.  Do not drive while wearing the collar.   Only take over-the-counter or prescription medicines for pain, discomfort, or fever as directed by your health care provider.   Keep all follow-up appointments as directed by your health care provider.   Keep all physical therapy appointments as directed by your health care provider.   Make any needed adjustments to your workstation to promote good posture.   Avoid positions and activities that make your symptoms worse.   Warm up and stretch before being active to help prevent problems.  SEEK MEDICAL CARE IF:   Your pain is not controlled with medicine.   You are unable to decrease your pain medicine over time as planned.   Your activity level is not improving as expected.  SEEK IMMEDIATE MEDICAL CARE IF:   You develop any bleeding.  You develop stomach upset.  You have signs of an allergic reaction to your medicine.   Your symptoms get worse.   You develop new, unexplained symptoms.   You have numbness, tingling, weakness, or paralysis in any part of your body.  MAKE SURE YOU:   Understand these instructions.  Will watch your condition.  Will get help  right away if you are not doing well or get worse. Document Released: 12/09/2006 Document Revised: 02/16/2013 Document Reviewed: 08/19/2012 [facility] Patient Information 2015 [place], [place]. This information is not intended to replace advice given to you by your health care provider. Make sure you discuss any questions you have with your health care provider.
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
## 2014-12-24 IMAGING — CT CT ABD-PELV W/O CM
2 of 4 series · 15 of 42 positions shown, 19 images · non-contrast
Comparison: Pelvic ultrasound 12/28/2010.  No prior CT.

CLINICAL DATA: Left lower quadrant pain.  Dysuria.

CT ABDOMEN AND PELVIS WITHOUT CONTRAST (CT UROGRAM)
TECHNIQUE: Contiguous axial images of the abdomen and pelvis
without oral or intravenous contrast were obtained.

[Series 2: renal stone · axial · 0.70mm/px · z∈[-474,-34]mm · 12 of 97 slices shown, 16 images]
[im 9/97  soft-tissue]
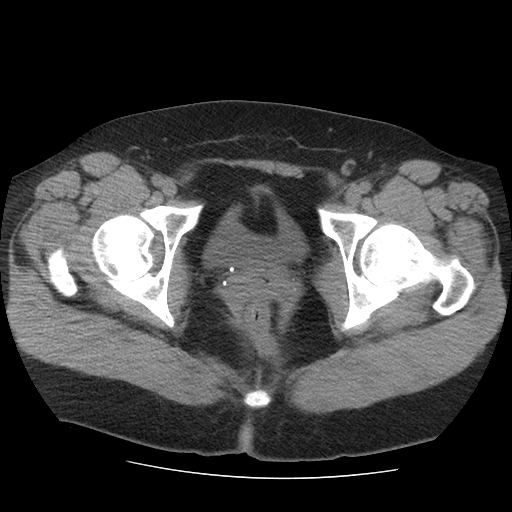
[im 9/97  bone]
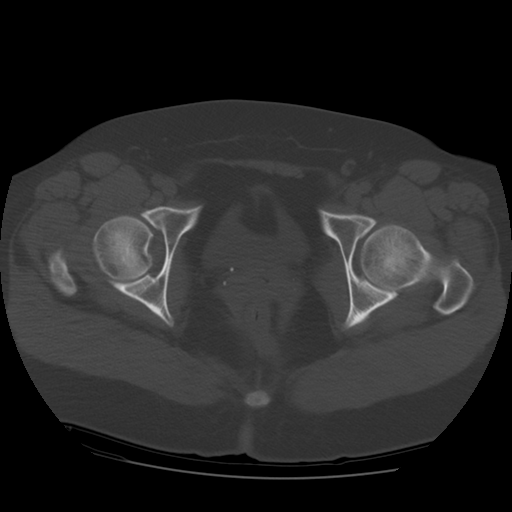
[im 17/97  soft-tissue]
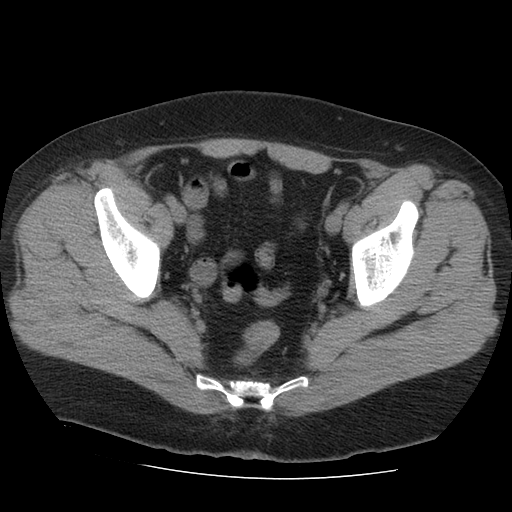
[im 25/97  soft-tissue]
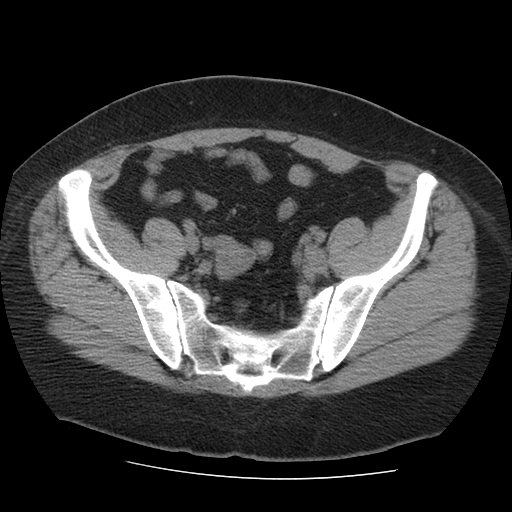
[im 37/97  soft-tissue]
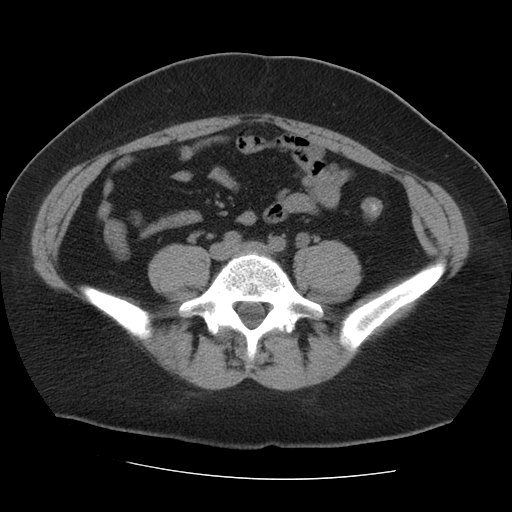
[im 45/97  soft-tissue]
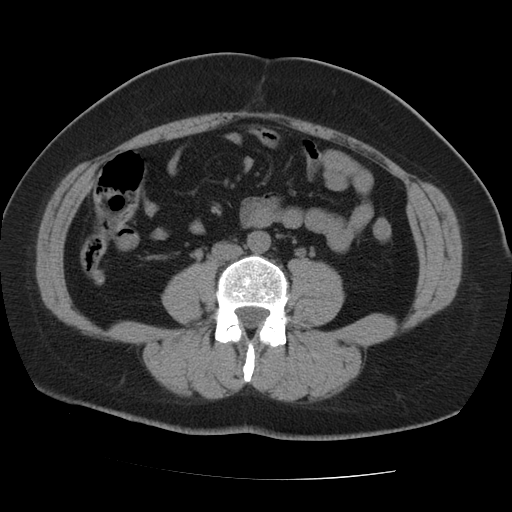
[im 53/97  soft-tissue]
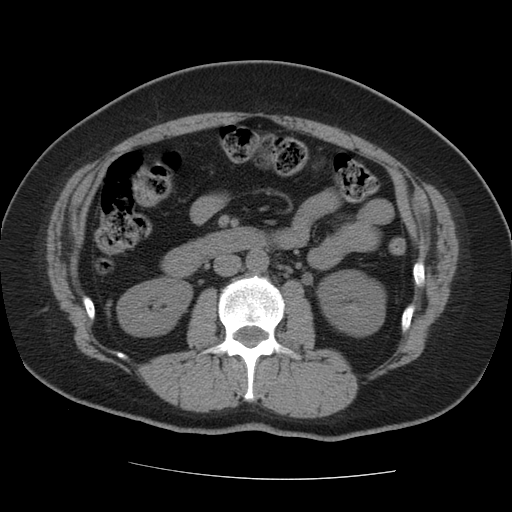
[im 61/97  soft-tissue]
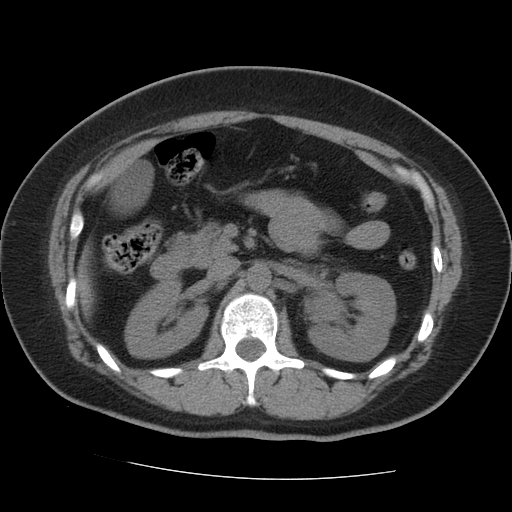
[im 73/97  soft-tissue]
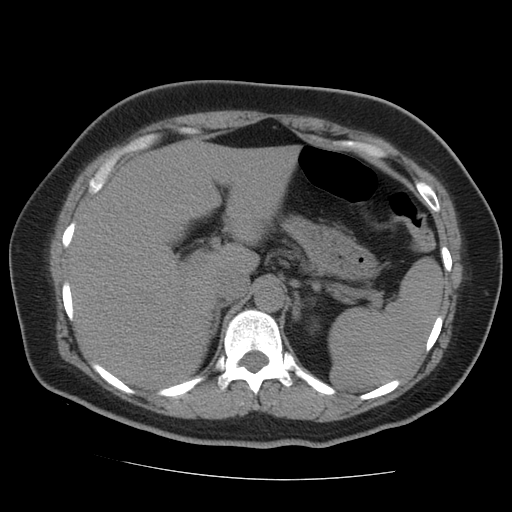
[im 81/97  soft-tissue]
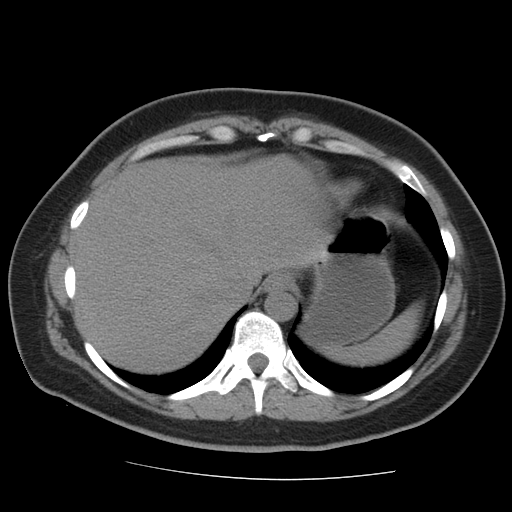
[im 81/97  lung]
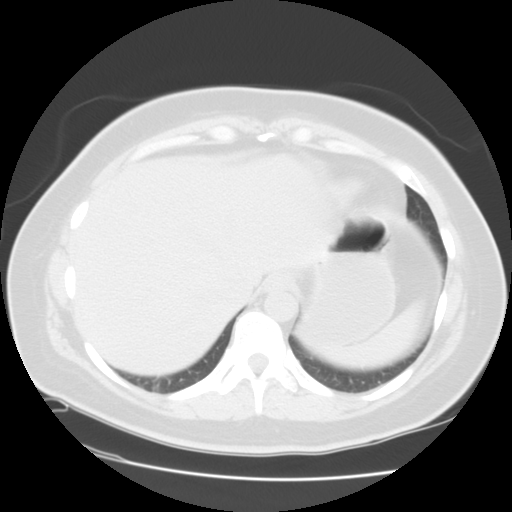
[im 81/97  bone]
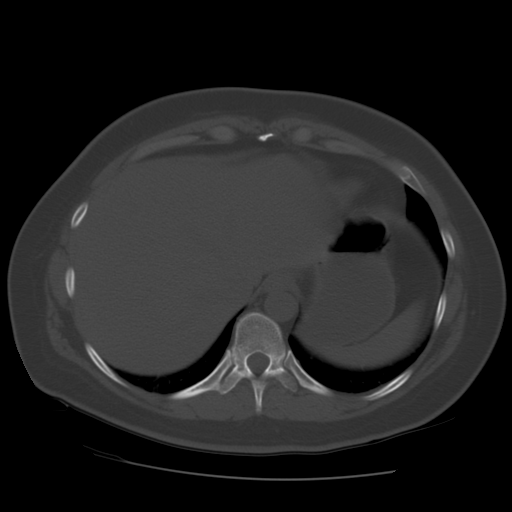
[im 85/97  lung]
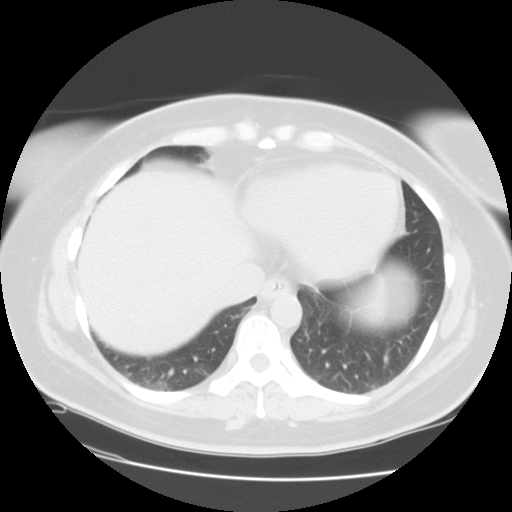
[im 89/97  soft-tissue]
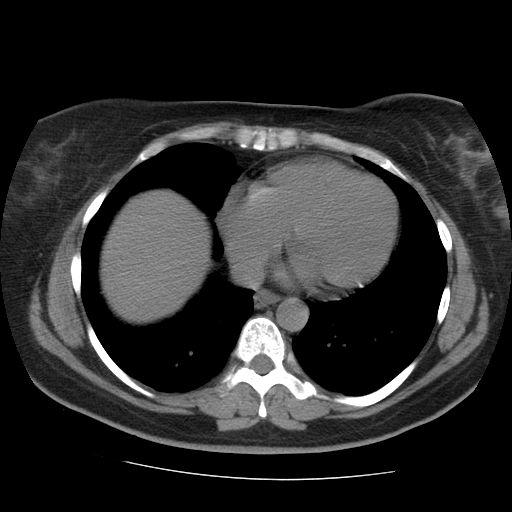
[im 89/97  lung]
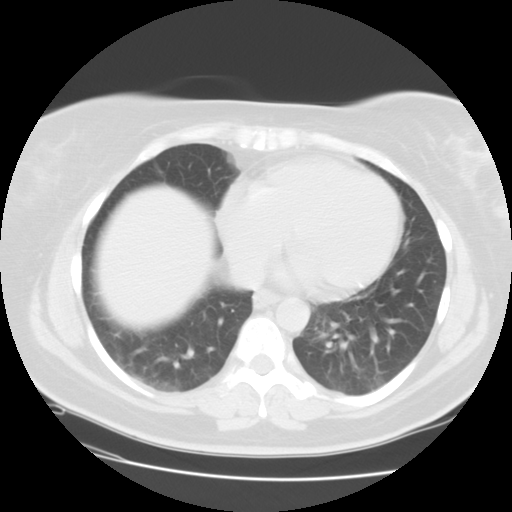
[im 93/97  lung]
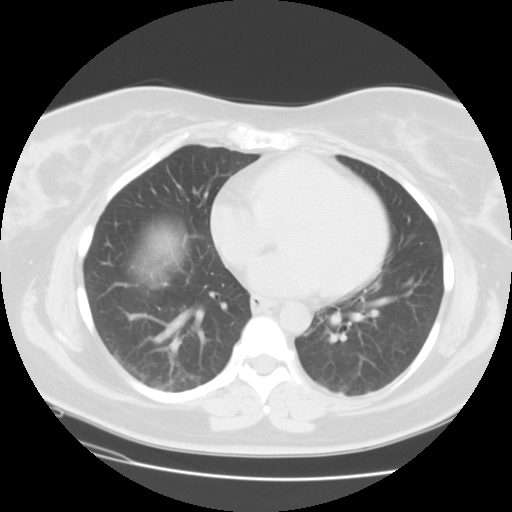

[Series 401: sagittals · sagittal · 1.03mm/px · 3 of 114 slices shown]
[im 23/114  soft-tissue]
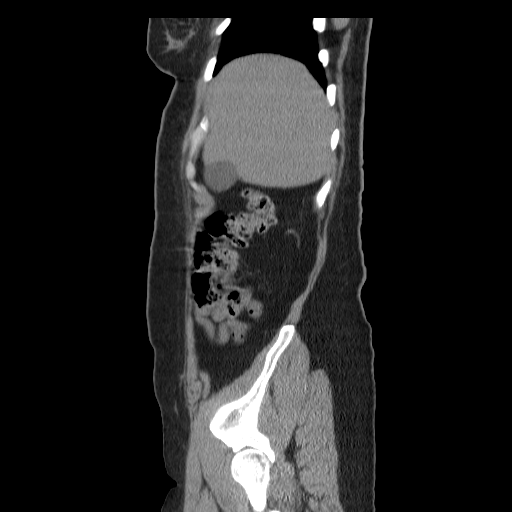
[im 46/114  soft-tissue]
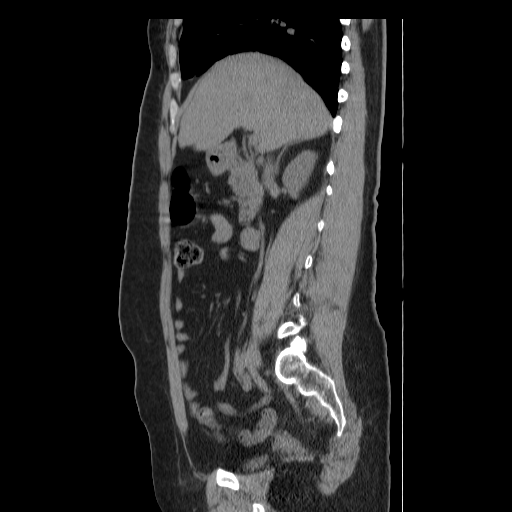
[im 68/114  soft-tissue]
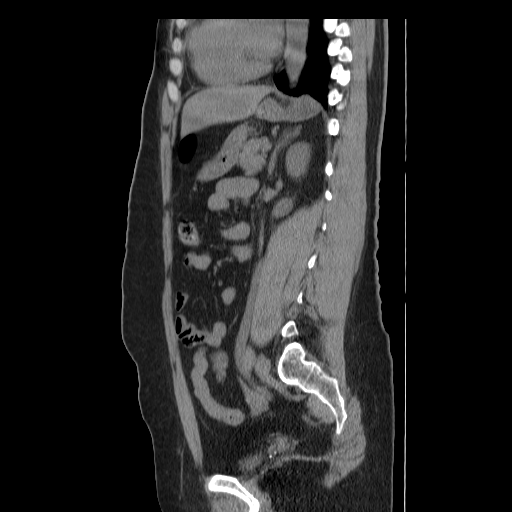

[15 of 42 positions shown; findings below may reference images not displayed]

FINDINGS: Exam is limited for evaluation of entities other than
urinary tract calculi due to lack of oral or intravenous contrast.

 Lung bases:  Patchy subsegmental atelectasis at the lung bases.
Normal heart size without pericardial or pleural effusion.

Abdomen/pelvis:  Normal uninfused appearance of the liver, spleen,
stomach, pancreas, gallbladder, biliary tract, adrenal glands,
right kidney.  Left nephrolithiasis.  Mild to moderate left-sided
hydroureteronephrosis.  There is a small punctate distal left
ureteric stone on image 90/series 2.  Punctate stone at the left
ureter vesicular junction on image 92/series 2.  Multiple other
pelvic calcifications which are felt to be phleboliths.  No right
hydroureter or right ureteric stone.

No retroperitoneal or retrocrural adenopathy.

Normal colon, appendix, and terminal ileum.  Normal small bowel
without abdominal ascites.

  No pelvic adenopathy.  Hysterectomy. No adnexal mass or
significant free fluid.

Bones/Musculoskeletal:  No acute osseous abnormality.
IMPRESSION: 1.  Mild to moderate left-sided urinary tract obstruction secondary
to a punctate ureterovesicular junction calculus.
2.  Possible distal left ureteric punctate calculus.
3.  Left nephrolithiasis.

## 2015-11-14 ENCOUNTER — Encounter (HOSPITAL_COMMUNITY): Payer: Self-pay | Admitting: Emergency Medicine

## 2015-11-14 ENCOUNTER — Emergency Department (HOSPITAL_COMMUNITY): Payer: Self-pay

## 2015-11-14 ENCOUNTER — Emergency Department (HOSPITAL_COMMUNITY)
Admission: EM | Admit: 2015-11-14 | Discharge: 2015-11-14 | Disposition: A | Discharge status: Home / Self Care | Payer: Self-pay | Attending: Emergency Medicine | Admitting: Emergency Medicine

## 2015-11-14 DIAGNOSIS — N390 Urinary tract infection, site not specified: Secondary | ICD-10-CM | POA: Insufficient documentation

## 2015-11-14 DIAGNOSIS — N23 Unspecified renal colic: Secondary | ICD-10-CM | POA: Insufficient documentation

## 2015-11-14 DIAGNOSIS — Z791 Long term (current) use of non-steroidal anti-inflammatories (NSAID): Secondary | ICD-10-CM | POA: Insufficient documentation

## 2015-11-14 LAB — CBC
HCT: 42 % (ref 36.0–46.0)
Hemoglobin: 14.3 g/dL (ref 12.0–15.0)
MCH: 30.2 pg (ref 26.0–34.0)
MCHC: 34 g/dL (ref 30.0–36.0)
MCV: 88.6 fL (ref 78.0–100.0)
Platelets: 258 10*3/uL (ref 150–400)
RBC: 4.74 MIL/uL (ref 3.87–5.11)
RDW: 13.2 % (ref 11.5–15.5)
WBC: 13.6 10*3/uL — ABNORMAL HIGH (ref 4.0–10.5)

## 2015-11-14 LAB — URINALYSIS, ROUTINE W REFLEX MICROSCOPIC
Bilirubin Urine: NEGATIVE
Glucose, UA: NEGATIVE mg/dL
Ketones, ur: NEGATIVE mg/dL
Leukocytes, UA: NEGATIVE
Nitrite: NEGATIVE
Protein, ur: NEGATIVE mg/dL
Specific Gravity, Urine: 1.025 (ref 1.005–1.030)
pH: 6 (ref 5.0–8.0)

## 2015-11-14 LAB — COMPREHENSIVE METABOLIC PANEL
ALT: 17 U/L (ref 14–54)
AST: 26 U/L (ref 15–41)
Albumin: 4.7 g/dL (ref 3.5–5.0)
Alkaline Phosphatase: 66 U/L (ref 38–126)
Anion gap: 12 (ref 5–15)
BUN: 16 mg/dL (ref 6–20)
CO2: 20 mmol/L — ABNORMAL LOW (ref 22–32)
Calcium: 9.8 mg/dL (ref 8.9–10.3)
Chloride: 107 mmol/L (ref 101–111)
Creatinine, Ser: 0.96 mg/dL (ref 0.44–1.00)
GFR calc Af Amer: >60 mL/min (ref >60)
GFR calc non Af Amer: >60 mL/min (ref >60)
Glucose, Bld: 128 mg/dL — ABNORMAL HIGH (ref 65–99)
Potassium: 4 mmol/L (ref 3.5–5.1)
Sodium: 139 mmol/L (ref 135–145)
Total Bilirubin: 0.7 mg/dL (ref 0.3–1.2)
Total Protein: 8.8 g/dL — ABNORMAL HIGH (ref 6.5–8.1)

## 2015-11-14 LAB — LIPASE, BLOOD: Lipase: 27 U/L (ref 11–51)

## 2015-11-14 LAB — URINE MICROSCOPIC-ADD ON

## 2015-11-14 MED ORDER — SULFAMETHOXAZOLE-TRIMETHOPRIM 800-160 MG PO TABS: 1.0000 | ORAL_TABLET | Freq: Two times a day (BID) | ORAL | 0 refills | Status: AC

## 2015-11-14 MED ORDER — ONDANSETRON HCL 4 MG PO TABS: 4.0000 mg | ORAL_TABLET | Freq: Four times a day (QID) | ORAL | 0 refills | Status: DC

## 2015-11-14 MED ORDER — ONDANSETRON HCL 4 MG/2ML IJ SOLN
4.0000 mg | Freq: Once | INTRAMUSCULAR | Status: AC
  Administered 2015-11-14: 4 mg via INTRAVENOUS
  Filled 2015-11-14: qty 2

## 2015-11-14 MED ORDER — MORPHINE SULFATE (PF) 2 MG/ML IV SOLN
INTRAVENOUS | Status: AC
  Administered 2015-11-14: 4 mg via INTRAVENOUS
  Filled 2015-11-14: qty 2

## 2015-11-14 MED ORDER — MORPHINE SULFATE (PF) 4 MG/ML IV SOLN
4.0000 mg | Freq: Once | INTRAVENOUS | Status: AC
  Administered 2015-11-14: 4 mg via INTRAVENOUS
  Filled 2015-11-14: qty 1

## 2015-11-14 MED ORDER — SODIUM CHLORIDE 0.9 % IV BOLUS (SEPSIS)
1000.0000 mL | Freq: Once | INTRAVENOUS | Status: AC
  Administered 2015-11-14: 1000 mL via INTRAVENOUS

## 2015-11-14 MED ORDER — HYDROCODONE-ACETAMINOPHEN 5-325 MG PO TABS: 1.0000 | ORAL_TABLET | ORAL | 0 refills | Status: DC | PRN

## 2015-11-14 MED ORDER — SULFAMETHOXAZOLE-TRIMETHOPRIM 800-160 MG PO TABS
1.0000 | ORAL_TABLET | Freq: Once | ORAL | Status: AC
  Administered 2015-11-14: 1 via ORAL
  Filled 2015-11-14: qty 1

## 2015-11-14 NOTE — ED Triage Notes (Signed)
Per EMS, pt c/o LLQ abdominal pain onset today at 0600, pallor, 1 episode emesis. Took 800 mg ibuprofen on empty stomach today.

## 2015-11-14 NOTE — ED Provider Notes (Signed)
[facility] DEPT Provider Note   CSN: [id number] Arrival date & time: 11/14/15  [id number]     History   Chief Complaint No chief complaint on file.   HPI Erin Travis is a 50 y.o. female.  Pt said that she developed LLQ abdominal pain around 0600.  She said that she had some vomiting as well.  She came via EMS and looks very uncomfortable in the room.      Past Medical History:  Diagnosis Date  . Anxiety    no meds  . DUB (dysfunctional uterine bleeding)   . Headache(784.0)   . Hemorrhoids   . Obesity (BMI 30-39.9)    BMI 30  . PONV (postoperative nausea and vomiting)   . 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 Wears glasses     Patient Active Problem List   Diagnosis Date Noted  . 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 Family history of throat cancer 09/10/2012  . Hyperglycemia 09/10/2012  . Nephrolithiasis 08/27/2012    Past Surgical History:  Procedure Laterality Date  . ABDOMINAL HYSTERECTOMY    . ABDOMINAL HYSTERECTOMY    . BREAST LUMPECTOMY WITH NEEDLE LOCALIZATION Left 06/01/2013   Procedure: BREAST LUMPECTOMY WITH NEEDLE LOCALIZATION;  Surgeon: Odis Hollingshead, MD;  Location: [facility];  Service: General;  Laterality: Left;  . CYSTOSCOPY  04/22/2011   Procedure: CYSTOSCOPY;  Surgeon: Myra C. Hulan Fray, MD;  Location: [facility];  Service: Gynecology;  Laterality: N/A;  . LAPAROSCOPY  11/2004  . MULTIPLE TOOTH EXTRACTIONS    . SVD     x 2  . TONSILLECTOMY AND ADENOIDECTOMY    . TUBAL LIGATION  07/2005  . WISDOM TOOTH EXTRACTION      OB History    Gravida Para Term Preterm AB Living   2 2       2    SAB TAB Ectopic Multiple Live Births                   Home Medications    Prior to Admission medications   Medication Sig Start Date End Date Taking? Authorizing Provider  ibuprofen (ADVIL,MOTRIN) 800 MG tablet Take 800 mg by mouth every 8 (eight) hours as  needed for moderate pain.   Yes Historical Provider, MD  HYDROcodone-acetaminophen (NORCO/VICODIN) 5-325 MG tablet Take 1 tablet by mouth every 4 (four) hours as needed. 11/14/15   Isla Pence, MD  ondansetron (ZOFRAN) 4 MG tablet Take 1 tablet (4 mg total) by mouth every 6 (six) hours. 11/14/15   Isla Pence, MD  sulfamethoxazole-trimethoprim (BACTRIM DS,SEPTRA DS) 800-160 MG tablet Take 1 tablet by mouth 2 (two) times daily. 11/14/15 11/21/15  Isla Pence, MD    Family History Family History  Problem Relation Age of Onset  . Cancer Father     throat  . Lung disease Mother   . Cancer Paternal Grandmother     stomach  . Hypertension Brother     Social History Social History  Substance Use Topics  . Smoking status: Never Smoker  . Smokeless tobacco: Never Used  . Alcohol use No     Allergies   Latex and Penicillins   Review of Systems Review of Systems  Gastrointestinal: Positive for abdominal pain, nausea and vomiting.  All other systems reviewed and are negative.    Physical Exam Updated Vital Signs BP 120/60 (BP Location: Left Arm)  Pulse (!) 57   Temp 97.8 F (36.6 C) (Oral)   Resp 15   LMP 04/17/2011   SpO2 100%   Physical Exam  Constitutional: She is oriented to person, place, and time. She appears well-developed and well-nourished. She appears distressed.  HENT:  Head: Normocephalic and atraumatic.  Right Ear: External ear normal.  Left Ear: External ear normal.  Nose: Nose normal.  Mouth/Throat: Oropharynx is clear and moist.  Eyes: Conjunctivae and EOM are normal. Pupils are equal, round, and reactive to light.  Neck: Normal range of motion. Neck supple.  Cardiovascular: Normal rate, regular rhythm, normal heart sounds and intact distal pulses.   Pulmonary/Chest: Effort normal and breath sounds normal.  Abdominal: There is tenderness in the left lower quadrant.  Musculoskeletal: Normal range of motion.  Neurological: She is alert and oriented  to person, place, and time.  Skin: Skin is warm and dry.  Psychiatric: She has a normal mood and affect. Her behavior is normal. Judgment and thought content normal.  Nursing note and vitals reviewed.    ED Treatments / Results  Labs (all labs ordered are listed, but only abnormal results are displayed) Labs Reviewed  URINE CULTURE  LIPASE, BLOOD  COMPREHENSIVE METABOLIC PANEL  CBC  URINALYSIS, ROUTINE W REFLEX MICROSCOPIC (NOT AT [facility])    EKG  EKG Interpretation None       Radiology Ct Renal Stone Study  Result Date: 11/14/2015 CLINICAL DATA:  Left lower quadrant abdominal pain onset today. Vomiting. EXAM: CT ABDOMEN AND PELVIS WITHOUT CONTRAST TECHNIQUE: Multidetector CT imaging of the abdomen and pelvis was performed following the standard protocol without IV contrast. COMPARISON:  08/22/2012 FINDINGS: Lower chest: Lung bases are clear. No effusions. Heart is normal size. Hepatobiliary: No focal hepatic abnormality. Gallbladder unremarkable. Pancreas: No focal abnormality or ductal dilatation. Spleen: No focal abnormality.  Normal size. Adrenals/Urinary Tract: Punctate nonobstructing stone in the midpole of the left kidney. No hydronephrosis. Multiple calcifications in the pelvis felt represent phleboliths. No definite ureteral stone. Stomach/Bowel: Appendix is normal. Stomach, large and small bowel grossly unremarkable. Vascular/Lymphatic: No evidence of aneurysm or adenopathy. Reproductive: Prior hysterectomy.  No adnexal masses. Other: No free fluid or free air. Musculoskeletal: No acute bony abnormality or focal bone lesion. IMPRESSION: Punctate left midpole renal stone, nonobstructing. No hydronephrosis or visible ureteral stones. Multiple calcifications in the pelvis are felt represent phleboliths. Normal appendix. Electronically Signed   By: Rolm Baptise M.D.   On: 11/14/2015 11:31    Procedures Procedures (including critical care time)  Medications Ordered in  ED Medications  sodium chloride 0.9 % bolus 1,000 mL (0 mLs Intravenous Stopped 11/14/15 1235)  morphine 4 MG/ML injection 4 mg (4 mg Intravenous Given 11/14/15 1040)  ondansetron (ZOFRAN) injection 4 mg (4 mg Intravenous Given 11/14/15 1039)  morphine 2 MG/ML injection (4 mg Intravenous Given 11/14/15 1213)  sulfamethoxazole-trimethoprim (BACTRIM DS,SEPTRA DS) 800-160 MG per tablet 1 tablet (1 tablet Oral Given 11/14/15 1259)     Initial Impression / Assessment and Plan / ED Course  I have reviewed the triage vital signs and the nursing notes.  Pertinent labs & imaging results that were available during my care of the patient were reviewed by me and considered in my medical decision making (see chart for details).  Clinical Course   WBC 13.6, Hgb 14.3, HCt 42, Plt 258 BMP nl UA  TNTC RBCs, many bacteria  UA will be sent for culture and pt will be treated with bactrim.  Pt is  feeling much better.  Pt said she felt relief prior to the CT scan.  I suspect pt passed a stone before the CT.   Final Clinical Impressions(s) / ED Diagnoses   Final diagnoses:  UTI (lower urinary tract infection)  Renal colic on left side    New Prescriptions New Prescriptions   HYDROCODONE-ACETAMINOPHEN (NORCO/VICODIN) 5-325 MG TABLET    Take 1 tablet by mouth every 4 (four) hours as needed.   ONDANSETRON (ZOFRAN) 4 MG TABLET    Take 1 tablet (4 mg total) by mouth every 6 (six) hours.   SULFAMETHOXAZOLE-TRIMETHOPRIM (BACTRIM DS,SEPTRA DS) 800-160 MG TABLET    Take 1 tablet by mouth 2 (two) times daily.     Isla Pence, MD 11/14/15 [phone]

## 2015-11-15 LAB — URINE CULTURE: Culture: NO GROWTH

## 2016-10-09 ENCOUNTER — Inpatient Hospital Stay (HOSPITAL_COMMUNITY)
Admission: AD | Admit: 2016-10-09 | Discharge: 2016-10-09 | Disposition: A | Discharge status: Home / Self Care | Payer: Self-pay | Source: Ambulatory Visit | Attending: Family Medicine | Admitting: Family Medicine

## 2016-10-09 ENCOUNTER — Inpatient Hospital Stay (HOSPITAL_COMMUNITY): Payer: Self-pay

## 2016-10-09 ENCOUNTER — Encounter (HOSPITAL_COMMUNITY): Payer: Self-pay | Admitting: Student

## 2016-10-09 DIAGNOSIS — N83201 Unspecified ovarian cyst, right side: Secondary | ICD-10-CM | POA: Insufficient documentation

## 2016-10-09 DIAGNOSIS — Z91040 Latex allergy status: Secondary | ICD-10-CM | POA: Insufficient documentation

## 2016-10-09 DIAGNOSIS — Z808 Family history of malignant neoplasm of other organs or systems: Secondary | ICD-10-CM | POA: Insufficient documentation

## 2016-10-09 DIAGNOSIS — Z8249 Family history of ischemic heart disease and other diseases of the circulatory system: Secondary | ICD-10-CM | POA: Insufficient documentation

## 2016-10-09 DIAGNOSIS — Z9889 Other specified postprocedural states: Secondary | ICD-10-CM | POA: Insufficient documentation

## 2016-10-09 DIAGNOSIS — R1031 Right lower quadrant pain: Secondary | ICD-10-CM | POA: Insufficient documentation

## 2016-10-09 DIAGNOSIS — Z6833 Body mass index (BMI) 33.0-33.9, adult: Secondary | ICD-10-CM | POA: Insufficient documentation

## 2016-10-09 DIAGNOSIS — E669 Obesity, unspecified: Secondary | ICD-10-CM | POA: Insufficient documentation

## 2016-10-09 DIAGNOSIS — R51 Headache: Secondary | ICD-10-CM | POA: Insufficient documentation

## 2016-10-09 DIAGNOSIS — Z880 Allergy status to penicillin: Secondary | ICD-10-CM | POA: Insufficient documentation

## 2016-10-09 DIAGNOSIS — N938 Other specified abnormal uterine and vaginal bleeding: Secondary | ICD-10-CM | POA: Insufficient documentation

## 2016-10-09 DIAGNOSIS — Z90710 Acquired absence of both cervix and uterus: Secondary | ICD-10-CM | POA: Insufficient documentation

## 2016-10-09 DIAGNOSIS — Z800 Family history of malignant neoplasm of digestive organs: Secondary | ICD-10-CM | POA: Insufficient documentation

## 2016-10-09 HISTORY — DX: Endometriosis, unspecified: N80.9

## 2016-10-09 LAB — URINALYSIS, ROUTINE W REFLEX MICROSCOPIC
Bilirubin Urine: NEGATIVE
Glucose, UA: 50 mg/dL — AB
Hgb urine dipstick: NEGATIVE
Ketones, ur: NEGATIVE mg/dL
Leukocytes, UA: NEGATIVE
Nitrite: NEGATIVE
Protein, ur: NEGATIVE mg/dL
Specific Gravity, Urine: 1.023 (ref 1.005–1.030)
pH: 5 (ref 5.0–8.0)

## 2016-10-09 LAB — CBC
HCT: 38 % (ref 36.0–46.0)
Hemoglobin: 12.8 g/dL (ref 12.0–15.0)
MCH: 29.8 pg (ref 26.0–34.0)
MCHC: 33.7 g/dL (ref 30.0–36.0)
MCV: 88.6 fL (ref 78.0–100.0)
Platelets: 200 10*3/uL (ref 150–400)
RBC: 4.29 MIL/uL (ref 3.87–5.11)
RDW: 13.6 % (ref 11.5–15.5)
WBC: 6.3 10*3/uL (ref 4.0–10.5)

## 2016-10-09 MED ORDER — KETOROLAC TROMETHAMINE 60 MG/2ML IM SOLN
60.0000 mg | Freq: Once | INTRAMUSCULAR | Status: AC
  Administered 2016-10-09: 60 mg via INTRAMUSCULAR
  Filled 2016-10-09: qty 2

## 2016-10-09 MED ORDER — IBUPROFEN 600 MG PO TABS: 600.0000 mg | ORAL_TABLET | Freq: Four times a day (QID) | ORAL | 0 refills | Status: DC | PRN

## 2016-10-09 NOTE — MAU Note (Signed)
Pt having right sided pain, thinks it's ovarian pain. Had a hysterectomy a few years ago but has not followed up with GYN for this problem.

## 2016-10-09 NOTE — MAU Provider Note (Signed)
History     CSN: [id number]  Arrival date and time: 10/09/16 4098  First Provider Initiated Contact with Patient 10/09/16 [phone]      Chief Complaint  Patient presents with  . Abdominal Pain   HPI Erin Travis is a 51 y.o. non pregnant female who presents with abdominal pain. Hx of RATH d/t AUB, was diagnosed with endometriosis at that time.  Current symptoms started 2 days ago. Reports constant RLQ pain that is sharp & cramp like. Rates pain 7/10. Pain worse when she bends over. Took 600 mg Ibuprofen yesterday with some relief. Has not taken medication today. Denies fever/chills, n/v/d, constipation, dysuria, vaginal bleeding, or vaginal discharge. Last BM was yesterday. Is in monogamous relationship x 3 years. Denies dyspareunia or postcoital bleeding.     Past Medical History:  Diagnosis Date  . Anxiety    no meds  . DUB (dysfunctional uterine bleeding)   . Headache(784.0)   . Hemorrhoids   . Obesity (BMI 30-39.9)    BMI 30  . PONV (postoperative nausea and vomiting)   . Seizures ([place])    caused by fever as child - no seizures since childhood, no meds  . Wears glasses     Past Surgical History:  Procedure Laterality Date  . ABDOMINAL HYSTERECTOMY    . BREAST LUMPECTOMY WITH NEEDLE LOCALIZATION Left 06/01/2013   Procedure: BREAST LUMPECTOMY WITH NEEDLE LOCALIZATION;  Surgeon: Odis Hollingshead, MD;  Location: [facility];  Service: General;  Laterality: Left;  . CYSTOSCOPY  04/22/2011   Procedure: CYSTOSCOPY;  Surgeon: Myra C. Hulan Fray, MD;  Location: [facility];  Service: Gynecology;  Laterality: N/A;  . LAPAROSCOPY  11/2004  . MULTIPLE TOOTH EXTRACTIONS    . SVD     x 2  . TONSILLECTOMY AND ADENOIDECTOMY    . TUBAL LIGATION  07/2005  . WISDOM TOOTH EXTRACTION      Family History  Problem Relation Age of Onset  . Cancer Father        throat  . Lung disease Mother   . Cancer Paternal Grandmother        stomach  . Hypertension Brother     Social History   Substance Use Topics  . Smoking status: Never Smoker  . Smokeless tobacco: Never Used  . Alcohol use No    Allergies:  Allergies  Allergen Reactions  . Latex Itching    Vaginal itching and irritation  . Penicillins Itching    Has patient had a PCN reaction causing immediate rash, facial/tongue/throat swelling, SOB or lightheadedness with hypotension: Yes Has patient had a PCN reaction causing severe rash involving mucus membranes or skin necrosis: No Has patient had a PCN reaction that required hospitalization No Has patient had a PCN reaction occurring within the last 10 years: No If all of the above answers are "NO", then may proceed with Cephalosporin use.     Prescriptions Prior to Admission  Medication Sig Dispense Refill Last Dose  . HYDROcodone-acetaminophen (NORCO/VICODIN) 5-325 MG tablet Take 1 tablet by mouth every 4 (four) hours as needed. 10 tablet 0   . ibuprofen (ADVIL,MOTRIN) 800 MG tablet Take 800 mg by mouth every 8 (eight) hours as needed for moderate pain.   11/14/2015 at Unknown time  . ondansetron (ZOFRAN) 4 MG tablet Take 1 tablet (4 mg total) by mouth every 6 (six) hours. 12 tablet 0     Review of Systems  Constitutional: Negative.   Gastrointestinal: Positive for abdominal pain. Negative  for constipation, diarrhea, nausea and vomiting.  Genitourinary: Negative.    Physical Exam   Blood pressure 139/77, pulse 64, temperature 97.7 F (36.5 C), resp. rate 16, height 5' 7.5" (1.715 m), weight 215 lb (97.5 kg), last menstrual period 04/17/2011.  Physical Exam  Nursing note and vitals reviewed. Constitutional: She is oriented to person, place, and time. She appears well-developed and well-nourished. No distress.  HENT:  Head: Normocephalic and atraumatic.  Eyes: Conjunctivae are normal. Right eye exhibits no discharge. Left eye exhibits no discharge. No scleral icterus.  Neck: Normal range of motion.  Cardiovascular: Normal rate, regular rhythm and  normal heart sounds.   No murmur heard. Respiratory: Effort normal and breath sounds normal. No respiratory distress. She has no wheezes.  GI: Soft. Bowel sounds are normal. There is no tenderness. There is no rigidity, no rebound and no guarding.  Genitourinary: Right adnexum displays tenderness. Left adnexum displays no mass and no tenderness.  Neurological: She is alert and oriented to person, place, and time.  Skin: Skin is warm and dry. She is not diaphoretic.  Psychiatric: She has a normal mood and affect. Her behavior is normal. Judgment and thought content normal.    MAU Course  Procedures Results for orders placed or performed during the hospital encounter of 10/09/16 (from the past 24 hour(s))  Urinalysis, Routine w reflex microscopic     Status: Abnormal   Collection Time: 10/09/16  9:30 AM  Result Value Ref Range   Color, Urine YELLOW YELLOW   APPearance CLEAR CLEAR   Specific Gravity, Urine 1.023 1.005 - 1.030   pH 5.0 5.0 - 8.0   Glucose, UA 50 (A) NEGATIVE mg/dL   Hgb urine dipstick NEGATIVE NEGATIVE   Bilirubin Urine NEGATIVE NEGATIVE   Ketones, ur NEGATIVE NEGATIVE mg/dL   Protein, ur NEGATIVE NEGATIVE mg/dL   Nitrite NEGATIVE NEGATIVE   Leukocytes, UA NEGATIVE NEGATIVE  CBC     Status: None   Collection Time: 10/09/16 10:49 AM  Result Value Ref Range   WBC 6.3 4.0 - 10.5 K/uL   RBC 4.29 3.87 - 5.11 MIL/uL   Hemoglobin 12.8 12.0 - 15.0 g/dL   HCT 38.0 36.0 - 46.0 %   MCV 88.6 78.0 - 100.0 fL   MCH 29.8 26.0 - 34.0 pg   MCHC 33.7 30.0 - 36.0 g/dL   RDW 13.6 11.5 - 15.5 %   Platelets 200 150 - 400 K/uL   US Transvaginal Non-ob  Result Date: 10/09/2016 CLINICAL DATA:  Right lower quadrant pain EXAM: TRANSABDOMINAL AND TRANSVAGINAL ULTRASOUND OF PELVIS TECHNIQUE: Both transabdominal and transvaginal ultrasound examinations of the pelvis were performed. Transabdominal technique was performed for global imaging of the pelvis including uterus, ovaries, adnexal  regions, and pelvic cul-de-sac. It was necessary to proceed with endovaginal exam following the transabdominal exam to visualize the ovaries/adnexa. COMPARISON:  CT 11/14/2015 FINDINGS: Uterus Measurements: Prior hysterectomy. Endometrium Thickness: N/A. Right ovary Measurements: 3.7 x 2.2 x 2.1 cm. Small simple appearing cyst or dominant follicle which measures up to 2.2 cm. No adnexal mass. Left ovary Measurements: 2.3 x 1.4 x 2.2 cm. Normal appearance/no adnexal mass. Other findings No abnormal free fluid. IMPRESSION: Prior hysterectomy. No acute or significant abnormality. Electronically Signed   By: Rolm Baptise M.D.   On: 10/09/2016 11:43   US Pelvis Complete  Result Date: 10/09/2016 CLINICAL DATA:  Right lower quadrant pain EXAM: TRANSABDOMINAL AND TRANSVAGINAL ULTRASOUND OF PELVIS TECHNIQUE: Both transabdominal and transvaginal ultrasound examinations of the pelvis were  performed. Transabdominal technique was performed for global imaging of the pelvis including uterus, ovaries, adnexal regions, and pelvic cul-de-sac. It was necessary to proceed with endovaginal exam following the transabdominal exam to visualize the ovaries/adnexa. COMPARISON:  CT 11/14/2015 FINDINGS: Uterus Measurements: Prior hysterectomy. Endometrium Thickness: N/A. Right ovary Measurements: 3.7 x 2.2 x 2.1 cm. Small simple appearing cyst or dominant follicle which measures up to 2.2 cm. No adnexal mass. Left ovary Measurements: 2.3 x 1.4 x 2.2 cm. Normal appearance/no adnexal mass. Other findings No abnormal free fluid. IMPRESSION: Prior hysterectomy. No acute or significant abnormality. Electronically Signed   By: Rolm Baptise M.D.   On: 10/09/2016 11:43    MDM VSS, NAD CBC, no leukocytosis & pt afebrile. No signs of surgical abdomen Toradol 60 mg IM resulted in improvement of pain Pt very tender in RLQ during bimanual exam. Pelvic ultrasound ordered. Ultrasound showed small simple cyst in right ovary. Will have pt f/u with  Dr. Hulan Fray per patient preference.  Assessment and Plan  A: 1. RLQ abdominal pain   2. Cyst of right ovary    P: Discharge home Rx ibuprofen  Discussed reasons to return to MAU vs ED Msg to office for appt with Dr. Barrington Ellison 10/09/2016, 9:49 AM

## 2016-10-09 NOTE — Discharge Instructions (Signed)
Ovarian Cyst An ovarian cyst is a fluid-filled sac that forms on an ovary. The ovaries are small organs that produce eggs in women. Various types of cysts can form on the ovaries. Some may cause symptoms and require treatment. Most ovarian cysts go away on their own, are not cancerous (are benign), and do not cause problems. Common types of ovarian cysts include:  Functional (follicle) cysts. ? Occur during the menstrual cycle, and usually go away with the next menstrual cycle if you do not get pregnant. ? Usually cause no symptoms.  Endometriomas. ? Are cysts that form from the tissue that lines the uterus (endometrium). ? Are sometimes called "chocolate cysts" because they become filled with blood that turns brown. ? Can cause pain in the lower abdomen during intercourse and during your period.  Cystadenoma cysts. ? Develop from cells on the outside surface of the ovary. ? Can get very large and cause lower abdomen pain and pain with intercourse. ? Can cause severe pain if they twist or break open (rupture).  Dermoid cysts. ? Are sometimes found in both ovaries. ? May contain different kinds of body tissue, such as skin, teeth, hair, or cartilage. ? Usually do not cause symptoms unless they get very big.  Theca lutein cysts. ? Occur when too much of a certain hormone (human chorionic gonadotropin) is produced and overstimulates the ovaries to produce an egg. ? Are most common after having procedures used to assist with the conception of a baby (in vitro fertilization).  What are the causes? Ovarian cysts may be caused by:  Ovarian hyperstimulation syndrome. This is a condition that can develop from taking fertility medicines. It causes multiple large ovarian cysts to form.  Polycystic ovarian syndrome (PCOS). This is a common hormonal disorder that can cause ovarian cysts, as well as problems with your period or fertility.  What increases the risk? The following factors may make  you more likely to develop ovarian cysts:  Being overweight or obese.  Taking fertility medicines.  Taking certain forms of hormonal birth control.  Smoking.  What are the signs or symptoms? Many ovarian cysts do not cause symptoms. If symptoms are present, they may include:  Pelvic pain or pressure.  Pain in the lower abdomen.  Pain during sex.  Abdominal swelling.  Abnormal menstrual periods.  Increasing pain with menstrual periods.  How is this diagnosed? These cysts are commonly found during a routine pelvic exam. You may have tests to find out more about the cyst, such as:  Ultrasound.  X-ray of the pelvis.  CT scan.  MRI.  Blood tests.  How is this treated? Many ovarian cysts go away on their own without treatment. Your health care provider may want to check your cyst regularly for 2-3 months to see if it changes. If you are in menopause, it is especially important to have your cyst monitored closely because menopausal women have a higher rate of ovarian cancer. When treatment is needed, it may include:  Medicines to help relieve pain.  A procedure to drain the cyst (aspiration).  Surgery to remove the whole cyst.  Hormone treatment or birth control pills. These methods are sometimes used to help dissolve a cyst.  Follow these instructions at home:  Take over-the-counter and prescription medicines only as told by your health care provider.  Do not drive or use heavy machinery while taking prescription pain medicine.  Get regular pelvic exams and Pap tests as often as told by your health care   provider.  Return to your normal activities as told by your health care provider. Ask your health care provider what activities are safe for you.  Do not use any products that contain nicotine or tobacco, such as cigarettes and e-cigarettes. If you need help quitting, ask your health care provider.  Keep all follow-up visits as told by your health care provider.  This is important. Contact a health care provider if:  Your periods are late, irregular, or painful, or they stop.  You have pelvic pain that does not go away.  You have pressure on your bladder or trouble emptying your bladder completely.  You have pain during sex.  You have any of the following in your abdomen: ? A feeling of fullness. ? Pressure. ? Discomfort. ? Pain that does not go away. ? Swelling.  You feel generally ill.  You become constipated.  You lose your appetite.  You develop severe acne.  You start to have more body hair and facial hair.  You are gaining weight or losing weight without changing your exercise and eating habits.  You think you may be pregnant. Get help right away if:  You have abdominal pain that is severe or gets worse.  You cannot eat or drink without vomiting.  You suddenly develop a fever.  Your menstrual period is much heavier than usual. This information is not intended to replace advice given to you by your health care provider. Make sure you discuss any questions you have with your health care provider. Document Released: 02/11/2005 Document Revised: 09/01/2015 Document Reviewed: 07/16/2015 Elsevier Interactive Patient Education  2018 [employer].  

## 2016-10-15 ENCOUNTER — Telehealth: Payer: Self-pay | Admitting: General Practice

## 2016-10-15 NOTE — Telephone Encounter (Signed)
Called patient to schedule with Dr. Hulan Fray.  Patient is scheduled on 11/13/17 at 2:20pm.  Patient voiced understanding.

## 2016-11-13 ENCOUNTER — Encounter: Payer: Self-pay | Admitting: Obstetrics & Gynecology

## 2016-11-13 ENCOUNTER — Ambulatory Visit (INDEPENDENT_AMBULATORY_CARE_PROVIDER_SITE_OTHER): Payer: Self-pay | Admitting: Obstetrics & Gynecology

## 2016-11-13 VITALS — BP 141/75 | HR 70 | Wt 214.2 lb

## 2016-11-13 DIAGNOSIS — N83209 Unspecified ovarian cyst, unspecified side: Secondary | ICD-10-CM

## 2016-11-13 DIAGNOSIS — R102 Pelvic and perineal pain: Secondary | ICD-10-CM

## 2016-11-13 NOTE — Progress Notes (Signed)
   Subjective:    Patient ID: Erin Travis, female    DOB: 01-10-1966, 51 y.o.   MRN: [id number]  HPI 51 yo DW lady here with pelvic pain for about the last week. She had an u/s that showed a right ovarian cyst about 2.5 cm   Review of Systems I did a RATH/BS in 2013 for DUB.    Objective:   Physical Exam Well nourished, well hydrated white female, no apparent distress Breathing, conversing, and ambulating normally Abd- benign Bimanual exam- no masses, no pain     Assessment & Plan:  Preventative care- she was given the paperwork for the mammogram scholarship Pelvic pain- reassurance given. She says that she has IBU at home and doesn't need another prescription.

## 2016-12-23 ENCOUNTER — Other Ambulatory Visit: Payer: Self-pay | Admitting: Obstetrics and Gynecology

## 2016-12-23 DIAGNOSIS — Z1231 Encounter for screening mammogram for malignant neoplasm of breast: Secondary | ICD-10-CM

## 2017-01-14 ENCOUNTER — Ambulatory Visit
Admission: RE | Admit: 2017-01-14 | Discharge: 2017-01-14 | Disposition: A | Discharge status: Home / Self Care | Payer: No Typology Code available for payment source | Source: Ambulatory Visit | Attending: Obstetrics and Gynecology | Admitting: Obstetrics and Gynecology

## 2017-01-14 ENCOUNTER — Ambulatory Visit (HOSPITAL_COMMUNITY)
Admission: RE | Admit: 2017-01-14 | Discharge: 2017-01-14 | Disposition: A | Discharge status: Home / Self Care | Payer: Self-pay | Source: Ambulatory Visit | Attending: Obstetrics and Gynecology | Admitting: Obstetrics and Gynecology

## 2017-01-14 ENCOUNTER — Encounter (HOSPITAL_COMMUNITY): Payer: Self-pay

## 2017-01-14 VITALS — BP 126/72 | Temp 98.3°F | Ht 67.0 in | Wt 214.6 lb

## 2017-01-14 DIAGNOSIS — Z1231 Encounter for screening mammogram for malignant neoplasm of breast: Secondary | ICD-10-CM

## 2017-01-14 DIAGNOSIS — Z1239 Encounter for other screening for malignant neoplasm of breast: Secondary | ICD-10-CM

## 2017-01-14 NOTE — Patient Instructions (Signed)
Explained breast self awareness with Erin Travis. Patient did not need a Pap smear today due to her history of a hysterectomy for benign reasons. Explained to patient that she doesn't need any further Pap smears due to her history of a hysterectomy for benign reasons. Referred patient to the [facility] for a screening mammogram. Appointment scheduled for Tuesday, January 14, 2017 at [facility]. Let patient know the [facility] will follow up with her within the next couple weeks with results of mammogram by letter or phone. Yaffa Seckman [facility] [place] verbalized understanding.  Bazil Dhanani, Arvil Chaco, RN 8:53 AM

## 2017-01-14 NOTE — Progress Notes (Signed)
No complaints today.   Pap Smear: Pap smear not completed today. Last Pap smear was 02/15/2011 at the [facility] and normal. Per patient has a history of an abnormal Pap smear 27 years ago that cryotherapy was completed for follow-up. Patient has a history of a hysterectomy 02/20/2011 due to AUB, Endometriosis, Menorrhagia, and Anemia. Patient no longer needs Pap smears due to her history of a hysterectomy for benign reasons per BCCCP and ACOG guidelines. Last Pap smear and pathology report from hysterectomy in Epic.  Physical exam: Breasts Breasts symmetrical. No skin abnormalities bilateral breasts. No nipple retraction bilateral breasts. No nipple discharge bilateral breasts. No lymphadenopathy. No lumps palpated bilateral breasts. No complaints of pain or tenderness on exam. Referred patient to the [facility] for a screening mammogram. Appointment scheduled for Tuesday, January 14, 2017 at [facility].        Pelvic/Bimanual No Pap smear completed today since patient has a history of a hysterectomy for benign reasons. Pap smear not indicated per BCCCP guidelines.   Smoking History: Patient has never smoked.  Patient Navigation: Patient education provided. Access to services provided for patient through [facility] program.   Colorectal Cancer Screening: Per patient has never had a colonoscopy completed. No complaints today. FIT Test given to patient to complete and return to BCCCP.

## 2017-01-15 ENCOUNTER — Encounter (HOSPITAL_COMMUNITY): Payer: Self-pay | Admitting: *Deleted

## 2017-01-20 ENCOUNTER — Other Ambulatory Visit: Payer: Self-pay | Admitting: Obstetrics and Gynecology

## 2017-03-06 LAB — FECAL OCCULT BLOOD, IMMUNOCHEMICAL

## 2017-03-10 ENCOUNTER — Telehealth (HOSPITAL_COMMUNITY): Payer: Self-pay

## 2017-03-10 NOTE — Telephone Encounter (Signed)
Left message with patient to call me back.

## 2018-02-04 ENCOUNTER — Other Ambulatory Visit (HOSPITAL_COMMUNITY): Payer: Self-pay | Admitting: *Deleted

## 2018-02-04 DIAGNOSIS — Z1231 Encounter for screening mammogram for malignant neoplasm of breast: Secondary | ICD-10-CM

## 2018-04-28 ENCOUNTER — Ambulatory Visit (HOSPITAL_COMMUNITY)
Admission: RE | Admit: 2018-04-28 | Discharge: 2018-04-28 | Disposition: A | Discharge status: Home / Self Care | Payer: Self-pay | Source: Ambulatory Visit | Attending: Obstetrics and Gynecology | Admitting: Obstetrics and Gynecology

## 2018-04-28 ENCOUNTER — Ambulatory Visit
Admission: RE | Admit: 2018-04-28 | Discharge: 2018-04-28 | Disposition: A | Discharge status: Home / Self Care | Payer: Self-pay | Source: Ambulatory Visit | Attending: Obstetrics and Gynecology | Admitting: Obstetrics and Gynecology

## 2018-04-28 ENCOUNTER — Encounter (HOSPITAL_COMMUNITY): Payer: Self-pay

## 2018-04-28 VITALS — BP 118/80 | Wt 219.0 lb

## 2018-04-28 DIAGNOSIS — Z1239 Encounter for other screening for malignant neoplasm of breast: Secondary | ICD-10-CM

## 2018-04-28 DIAGNOSIS — Z1231 Encounter for screening mammogram for malignant neoplasm of breast: Secondary | ICD-10-CM

## 2018-04-28 NOTE — Progress Notes (Signed)
No complaints today.   Pap Smear: Pap smear not completed today. Last Pap smear was 02/15/2011 at the [facility] and normal. Per patient has a history of an abnormal Pap smear 27 years ago that cryotherapy was completed for follow-up. Patient has a history of a hysterectomy 02/20/2011 due to AUB, Endometriosis, Menorrhagia, and Anemia. Patient no longer needs Pap smears due to her history of a hysterectomy for benign reasons per BCCCP and ACOG guidelines. Last Pap smear and pathology report from hysterectomy in Epic.  Physical exam: Breasts Right breast slightly larger than left breast that per patient is normal for her. Patient stated she has a history of a left breast lumpectomy for benign reasons in 2012 or 2013. No skin abnormalities bilateral breasts. No nipple retraction bilateral breasts. No nipple discharge bilateral breasts. No lymphadenopathy. No lumps palpated bilateral breasts. No complaints of pain or tenderness on exam. Referred patient to the [facility] for a screening mammogram. Appointment scheduled for Tuesday, April 28, 2018 at 1010.        Pelvic/Bimanual No Pap smear completed today since patient has a history of a hysterectomy for benign reasons. Pap smear not indicated per BCCCP guidelines.   Smoking History: Patient has never smoked.  Patient Navigation: Patient education provided. Access to services provided for patient through [facility] program.   Colorectal Cancer Screening: Per patient has never had a colonoscopy completed. No complaints today. FIT Test given to patient to complete and return to BCCCP.  Breast and Cervical Cancer Risk Assessment: Patient has no family history of breast cancer, known genetic mutations, or radiation treatment to the chest before age 25. Per patient has a history of cervical dysplasia. Patient has no history of being immunocompromised or DES exposure in-utero.  Risk Assessment    Risk Scores      04/28/2018   Last edited by: Armond Hang, LPN   5-year risk: 1 %   Lifetime risk: 8.7 %

## 2018-04-28 NOTE — Patient Instructions (Addendum)
Explained breast self awareness with [place]. Patient did not need a Pap smear today due to her history of a hysterectomy for benign reasons. Explained to patient that she doesn't need any further Pap smears due to her history of a hysterectomy for benign reasons. Referred patient to the [facility] for a screening mammogram. Appointment scheduled for Tuesday, April 28, 2018 at 1010. Let patient know the [facility] will follow up with her within the next couple weeks with results of mammogram by letter or phone. Erin Travis verbalized understanding.  Add Dinapoli, Arvil Chaco, RN 9:58 AM

## 2018-05-13 ENCOUNTER — Encounter (HOSPITAL_COMMUNITY): Payer: Self-pay | Admitting: *Deleted

## 2018-07-06 ENCOUNTER — Encounter (HOSPITAL_COMMUNITY): Payer: Self-pay

## 2018-07-06 ENCOUNTER — Other Ambulatory Visit: Payer: Self-pay

## 2018-07-06 ENCOUNTER — Ambulatory Visit (HOSPITAL_COMMUNITY)
Admission: EM | Admit: 2018-07-06 | Discharge: 2018-07-06 | Disposition: A | Discharge status: Home / Self Care | Payer: PRIVATE HEALTH INSURANCE | Attending: Family Medicine | Admitting: Family Medicine

## 2018-07-06 DIAGNOSIS — R55 Syncope and collapse: Secondary | ICD-10-CM | POA: Insufficient documentation

## 2018-07-06 DIAGNOSIS — R202 Paresthesia of skin: Secondary | ICD-10-CM

## 2018-07-06 DIAGNOSIS — R200 Anesthesia of skin: Secondary | ICD-10-CM

## 2018-07-06 LAB — COMPREHENSIVE METABOLIC PANEL
ALT: 20 U/L (ref 0–44)
AST: 24 U/L (ref 15–41)
Albumin: 3.9 g/dL (ref 3.5–5.0)
Alkaline Phosphatase: 79 U/L (ref 38–126)
Anion gap: 9 (ref 5–15)
BUN: 7 mg/dL (ref 6–20)
CO2: 25 mmol/L (ref 22–32)
Calcium: 9.5 mg/dL (ref 8.9–10.3)
Chloride: 106 mmol/L (ref 98–111)
Creatinine, Ser: 0.76 mg/dL (ref 0.44–1.00)
GFR calc Af Amer: >60 mL/min (ref >60)
GFR calc non Af Amer: >60 mL/min (ref >60)
Glucose, Bld: 133 mg/dL — ABNORMAL HIGH (ref 70–99)
Potassium: 3.5 mmol/L (ref 3.5–5.1)
Sodium: 140 mmol/L (ref 135–145)
Total Bilirubin: 0.7 mg/dL (ref 0.3–1.2)
Total Protein: 7.3 g/dL (ref 6.5–8.1)

## 2018-07-06 LAB — CBC WITH DIFFERENTIAL/PLATELET
Abs Immature Granulocytes: 0.03 10*3/uL (ref 0.00–0.07)
Basophils Absolute: 0 10*3/uL (ref 0.0–0.1)
Basophils Relative: 0 %
Eosinophils Absolute: 0.1 10*3/uL (ref 0.0–0.5)
Eosinophils Relative: 2 %
HCT: 40.5 % (ref 36.0–46.0)
Hemoglobin: 13.8 g/dL (ref 12.0–15.0)
Immature Granulocytes: 1 %
Lymphocytes Relative: 33 %
Lymphs Abs: 2.1 10*3/uL (ref 0.7–4.0)
MCH: 30.1 pg (ref 26.0–34.0)
MCHC: 34.1 g/dL (ref 30.0–36.0)
MCV: 88.2 fL (ref 80.0–100.0)
Monocytes Absolute: 0.5 10*3/uL (ref 0.1–1.0)
Monocytes Relative: 7 %
Neutro Abs: 3.6 10*3/uL (ref 1.7–7.7)
Neutrophils Relative %: 57 %
Platelets: 242 10*3/uL (ref 150–400)
RBC: 4.59 MIL/uL (ref 3.87–5.11)
RDW: 12.6 % (ref 11.5–15.5)
WBC: 6.4 10*3/uL (ref 4.0–10.5)
nRBC: 0 % (ref 0.0–0.2)

## 2018-07-06 LAB — GLUCOSE, CAPILLARY: Glucose-Capillary: 120 mg/dL — ABNORMAL HIGH (ref 70–99)

## 2018-07-06 NOTE — ED Triage Notes (Signed)
Patient presents to Urgent Care with complaints of facial and throat numbness, on and off for a week and a half. Patient reports she has not have difficulty breathing or swallowing, pt denies tingling or loss of taste.

## 2018-07-06 NOTE — Discharge Instructions (Signed)
Your ekg, heart rate and blood pressure are reassuring here today. Your blood sugar is reassuring as well.  I have a few other basic labs in process.  Will notify of any positive findings and if any changes to treatment are needed.   I would like you to follow up with your primary care provider for a recheck in the next 2 weeks if symptoms persist and may need further evaluation or referral.  If worsening of symptoms- actual loss of consciousness, nausea vomiting, chest pain, headache, vision changes, weakness, slurred speech, or otherwise worsening- please go to the ER.

## 2018-07-06 NOTE — ED Provider Notes (Signed)
[place]    CSN: [id number] Arrival date & time: 07/06/18  06-13-30     History   Chief Complaint Chief Complaint  Patient presents with  . Facial numbness    HPI Erin Travis is a 53 y.o. female.   Erin Travis presents with complaints of episodes in which her throat, chin, jaw, face and hands "feel funny" and numb. Last night felt worse and she felt like she was afraid to sleep because of it. Denies any previous similar. States she feels like she is going to pass out, has not passed out, however. Today feels better than last night. She denies specific dizziness, only once in the past week did she feel what she describes as dizzy. No nausea or diaphoresis. No chest pain. Took her BP last night and it was 145/71. Once it occurred while walking while at work, she grocery shops for work. Otherwise activity does not necessarily trigger symptoms. No chest pain  Or palpitations. The sensation can radiate from her face to her left shoulder. Denies shoulder involvement currently. No headache. No vision changes. No head injury. No weakness. No confusion. Took BC powder last night. No other medications for symptoms. She has been eating and drinking normally for her. Febrile seizures as a child, otherwise no previous neurological history. No cardiac history. Per chart review hx of anxiety. States her daughter's husband died in 06-13-22 of this year so has had some increased stress but denies any specific increased anxiety. No cough or other URI symptoms. Doesn't take any prescribed medications.     ROS per HPI, negative if not otherwise mentioned.      Past Medical History:  Diagnosis Date  . Anxiety    no meds  . DUB (dysfunctional uterine bleeding)   . Endometriosis   . Headache(784.0)   . Hemorrhoids   . Obesity (BMI 30-39.9)    BMI 30  . PONV (postoperative nausea and vomiting)   . Seizures ([place])    caused by fever as child - no seizures since childhood, no meds  .  Wears glasses     Patient Active Problem List   Diagnosis Date Noted  . 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 Family history of throat cancer 09/10/2012  . Hyperglycemia 09/10/2012  . Nephrolithiasis 08/27/2012    Past Surgical History:  Procedure Laterality Date  . ABDOMINAL HYSTERECTOMY    . ABDOMINAL HYSTERECTOMY    . BREAST EXCISIONAL BIOPSY Left   . BREAST LUMPECTOMY WITH NEEDLE LOCALIZATION Left 06/01/2013   Procedure: BREAST LUMPECTOMY WITH NEEDLE LOCALIZATION;  Surgeon: Odis Hollingshead, MD;  Location: [facility];  Service: General;  Laterality: Left;  . BREAST SURGERY    . CYSTOSCOPY  04/22/2011   Procedure: CYSTOSCOPY;  Surgeon: Myra C. Hulan Fray, MD;  Location: [facility];  Service: Gynecology;  Laterality: N/A;  . LAPAROSCOPY  11/2004  . MULTIPLE TOOTH EXTRACTIONS    . SVD     x 2  . TONSILLECTOMY AND ADENOIDECTOMY    . TUBAL LIGATION  07/2005  . WISDOM TOOTH EXTRACTION      OB History    Gravida  2   Para  2   Term      Preterm      AB      Living  2     SAB      TAB      Ectopic      Multiple  Live Births               Home Medications    Prior to Admission medications   Medication Sig Start Date End Date Taking? Authorizing Provider  ibuprofen (ADVIL,MOTRIN) 600 MG tablet Take 1 tablet (600 mg total) by mouth every 6 (six) hours as needed. 10/09/16  Yes Jorje Guild, NP    Family History Family History  Problem Relation Age of Onset  . Cancer Father        throat  . Lung disease Mother   . Cancer Paternal Grandmother        stomach  . Hypertension Brother     Social History Social History   Tobacco Use  . Smoking status: Never Smoker  . Smokeless tobacco: Never Used  Substance Use Topics  . Alcohol use: No  . Drug use: No     Allergies   Latex and Penicillins   Review of Systems Review of Systems   Physical Exam Triage Vital Signs ED Triage Vitals  Enc Vitals  Group     BP 07/06/18 1146 (!) 156/87     Pulse Rate 07/06/18 1146 80     Resp 07/06/18 1146 18     Temp 07/06/18 1146 98.1 F (36.7 C)     Temp Source 07/06/18 1146 Oral     SpO2 07/06/18 1146 100 %     Weight --      Height --      Head Circumference --      Peak Flow --      Pain Score 07/06/18 1145 0     Pain Loc --      Pain Edu? --      Excl. in [place]? --    No data found.  Updated Vital Signs BP (!) 156/87 (BP Location: Left Arm)   Pulse 80   Temp 98.1 F (36.7 C) (Oral)   Resp 18   LMP 04/17/2011   SpO2 100%    Physical Exam Constitutional:      General: She is not in acute distress.    Appearance: She is well-developed.  HENT:     Head: Normocephalic and atraumatic.     Mouth/Throat:     Mouth: Mucous membranes are moist.  Eyes:     Extraocular Movements: Extraocular movements intact.     Conjunctiva/sclera: Conjunctivae normal.     Pupils: Pupils are equal, round, and reactive to light.  Neck:     Musculoskeletal: Normal range of motion.  Cardiovascular:     Rate and Rhythm: Normal rate and regular rhythm.     Heart sounds: Normal heart sounds.  Pulmonary:     Effort: Pulmonary effort is normal.     Breath sounds: Normal breath sounds.  Skin:    General: Skin is warm and dry.  Neurological:     General: No focal deficit present.     Mental Status: She is alert and oriented to person, place, and time. Mental status is at baseline.     Cranial Nerves: No cranial nerve deficit.     Sensory: No sensory deficit.     Motor: No weakness.     Coordination: Coordination normal.     Comments: Patient intermittently will stop speaking to close her eyes and sit quietly stating that she feels the sensation to her face like she will pass out  Psychiatric:        Mood and Affect: Mood normal.    EKG: NSR rate  of 74 . Previous EKG was not available for review. No stwave changes as interpreted by me.    UC Treatments / Results  Labs (all labs ordered are  listed, but only abnormal results are displayed) Labs Reviewed  GLUCOSE, CAPILLARY - Abnormal; Notable for the following components:      Result Value   Glucose-Capillary 120 (*)    All other components within normal limits  CBC WITH DIFFERENTIAL/PLATELET  COMPREHENSIVE METABOLIC PANEL  CBG MONITORING, ED    EKG None  Radiology No results found.  Procedures Procedures (including critical care time)  Medications Ordered in UC Medications - No data to display  Initial Impression / Assessment and Plan / UC Course  I have reviewed the triage vital signs and the nursing notes.  Pertinent labs & imaging results that were available during my care of the patient were reviewed by me and considered in my medical decision making (see chart for details).     Vitals stable. No acute neurological findings, however she does experience this subjective sensation of neck jaw lateral face "tingling" as well as bilateral hand tingling. ekg reassuring in consideration of atypical acs. No chest pain . No nausea or diaphoresis. No cardiac hx. Blood sugar normal in clinic at 120. Basic labs pending. Neurological vs cardiac vs stress induced discussed and considered. Encouraged close follow up/establish with PCP for recheck as may need further eval/referral. Strict return precautions discussed. Patient verbalized understanding and agreeable to plan. Ambulatory out of clinic without difficulty.    Final Clinical Impressions(s) / UC Diagnoses   Final diagnoses:  Pre-syncope  Tingling sensation in face     Discharge Instructions     Your ekg, heart rate and blood pressure are reassuring here today. Your blood sugar is reassuring as well.  I have a few other basic labs in process.  Will notify of any positive findings and if any changes to treatment are needed.   I would like you to follow up with your primary care provider for a recheck in the next 2 weeks if symptoms persist and may need further  evaluation or referral.  If worsening of symptoms- actual loss of consciousness, nausea vomiting, chest pain, headache, vision changes, weakness, slurred speech, or otherwise worsening- please go to the ER.     ED Prescriptions    None     Controlled Substance Prescriptions [place] Controlled Substance Registry consulted? Not Applicable   Zigmund Gottron, NP 07/06/18 1254

## 2020-06-16 ENCOUNTER — Encounter: Payer: Self-pay | Admitting: Medical

## 2020-06-16 ENCOUNTER — Ambulatory Visit: Payer: BC Managed Care – PPO | Admitting: Medical

## 2020-06-16 ENCOUNTER — Other Ambulatory Visit: Payer: Self-pay

## 2020-06-16 VITALS — BP 130/78 | HR 79 | Temp 98.8°F | Resp 20 | Ht 67.0 in | Wt 225.0 lb

## 2020-06-16 DIAGNOSIS — G8929 Other chronic pain: Secondary | ICD-10-CM | POA: Diagnosis not present

## 2020-06-16 DIAGNOSIS — L989 Disorder of the skin and subcutaneous tissue, unspecified: Secondary | ICD-10-CM

## 2020-06-16 DIAGNOSIS — M25561 Pain in right knee: Secondary | ICD-10-CM | POA: Diagnosis not present

## 2020-06-16 DIAGNOSIS — Z1231 Encounter for screening mammogram for malignant neoplasm of breast: Secondary | ICD-10-CM | POA: Diagnosis not present

## 2020-06-16 NOTE — Patient Instructions (Signed)
Nice to meet you.   For area on skin we discussed I placed referral to dermatologist. If you don't get call by 10-14 days please call and notify me.  Hx of rt knee pain that is now much better. If pain returns or if you want referral to sports med let me know.   Placed screening mammogram order today.  Ask you scheduled appointment in about a month for wellness exam. Recommend early morning appointment and fast for 8 hours prior to lab draw.

## 2020-06-16 NOTE — Progress Notes (Signed)
Subjective:    Patient ID: Erin Travis, female    DOB: [birth year], 55 y.o.   MRN: [id number]  HPI  Pt in for first time.  Pt from [place]. Works for [employer]. Pt does not exercise regularly. Walks a lot at work.   History of rt knee pain for months. Pt states knees still hurt. Pt had xray and told normal on rt side. Pain less but hurts to bend down.    Pt has some skin lesions. She thinks moles on neck and also as bump on her rt shoulder.  Bumps on rt shoulder.    Review of Systems  Constitutional: Negative for chills, fatigue and fever.  HENT: Negative for congestion, ear pain and mouth sores.   Respiratory: Negative for cough, chest tightness, shortness of breath and wheezing.   Cardiovascular: Negative for chest pain and palpitations.  Gastrointestinal: Negative for abdominal pain, constipation, nausea and vomiting.  Musculoskeletal: Negative for back pain and neck pain.  Skin:       See hpi.  Neurological: Negative for dizziness, weakness, numbness and headaches.  Hematological: Negative for adenopathy. Does not bruise/bleed easily.  Psychiatric/Behavioral: Negative for behavioral problems and confusion.    Past Medical History:  Diagnosis Date  . Anxiety    no meds  . DUB (dysfunctional uterine bleeding)   . Endometriosis   . Headache(784.0)   . Hemorrhoids   . Obesity (BMI 30-39.9)    BMI 30  . PONV (postoperative nausea and vomiting)   . Seizures ([place])    caused by fever as child - no seizures since childhood, no meds  . Wears glasses      Social History   Socioeconomic History  . Marital status: Divorced    Spouse name: Not on file  . Number of children: 2  . Years of education: Not on file  . Highest education level: 12th grade  Occupational History  . Not on file  Tobacco Use  . Smoking status: Never Smoker  . Smokeless tobacco: Never Used  Vaping Use  . Vaping Use: Never used  Substance and Sexual Activity  . Alcohol use: No  . Drug  use: No  . Sexual activity: Never    Birth control/protection: Surgical  Other Topics Concern  . Not on file  Social History Narrative  . Not on file   Social Determinants of Health   Financial Resource Strain: Not on file  Food Insecurity: Not on file  Transportation Needs: Not on file  Physical Activity: Not on file  Stress: Not on file  Social Connections: Not on file  Intimate Partner Violence: Not on file    Past Surgical History:  Procedure Laterality Date  . ABDOMINAL HYSTERECTOMY    . ABDOMINAL HYSTERECTOMY    . BREAST EXCISIONAL BIOPSY Left   . BREAST LUMPECTOMY WITH NEEDLE LOCALIZATION Left 06/01/2013   Procedure: BREAST LUMPECTOMY WITH NEEDLE LOCALIZATION;  Surgeon: Odis Hollingshead, MD;  Location: [facility];  Service: General;  Laterality: Left;  . BREAST SURGERY    . CYSTOSCOPY  04/22/2011   Procedure: CYSTOSCOPY;  Surgeon: Myra C. Hulan Fray, MD;  Location: [facility];  Service: Gynecology;  Laterality: N/A;  . LAPAROSCOPY  11/2004  . MULTIPLE TOOTH EXTRACTIONS    . SVD     x 2  . TONSILLECTOMY AND ADENOIDECTOMY    . TUBAL LIGATION  07/2005  . WISDOM TOOTH EXTRACTION      Family History  Problem Relation Age of Onset  .  Cancer Father        throat  . Lung disease Mother   . Cancer Paternal Grandmother        stomach  . Hypertension Brother     Allergies  Allergen Reactions  . Latex Itching    Vaginal itching and irritation  . Penicillins Itching    Has patient had a PCN reaction causing immediate rash, facial/tongue/throat swelling, SOB or lightheadedness with hypotension: Yes Has patient had a PCN reaction causing severe rash involving mucus membranes or skin necrosis: No Has patient had a PCN reaction that required hospitalization No Has patient had a PCN reaction occurring within the last 10 years: No If all of the above answers are "NO", then may proceed with Cephalosporin use.     No current outpatient medications on file prior to  visit.   No current facility-administered medications on file prior to visit.    BP 139/64   Pulse 79   Temp 98.8 F (37.1 C) (Oral)   Resp 20   Ht 5\' 7"  (1.702 m)   Wt 225 lb (102.1 kg)   LMP 04/17/2011   SpO2 99%   BMI 35.24 kg/m       Objective:   Physical Exam  General Mental Status- Alert. General Appearance- Not in acute distress.   Skin Rt arm lesion on arm mole vs dermatofibroma. On neck various skin tag type lesions. One narrow at base but other thick at base.  Neck Carotid Arteries- Normal color. Moisture- Normal Moisture. No carotid bruits. No JVD.  Chest and Lung Exam Auscultation: Breath Sounds:-Normal.  Cardiovascular Auscultation:Rythm- Regular. Murmurs & Other Heart Sounds:Auscultation of the heart reveals- No Murmurs.  Abdomen Inspection:-Inspeection Normal. Palpation/Percussion:Note:No mass. Palpation and Percussion of the abdomen reveal- Non Tender, Non Distended + BS, no rebound or guarding.   Neurologic Cranial Nerve exam:- CN III-XII intact(No nystagmus), symmetric smile. Strength:- 5/5 equal and symmetric strength both upper and lower extremities.      Assessment & Plan:  Nice to meet you.   For area on skin we discussed I placed referral to dermatologist. If you don't get call by 10-14 days please call and notify me.  Hx of rt knee pain that is now much better. If pain returns or if you want referral to sports med let me know.   Placed screening mammogram order today.  Ask you scheduled appointment in about a month for wellness exam. Recommend early morning appointment and fast for 8 hours prior to lab draw.  Mackie Pai, PA-C

## 2020-07-17 ENCOUNTER — Ambulatory Visit (INDEPENDENT_AMBULATORY_CARE_PROVIDER_SITE_OTHER): Payer: BC Managed Care – PPO | Admitting: Medical

## 2020-07-17 ENCOUNTER — Other Ambulatory Visit: Payer: Self-pay

## 2020-07-17 VITALS — BP 129/74 | HR 67 | Resp 20 | Ht 67.0 in | Wt 222.0 lb

## 2020-07-17 DIAGNOSIS — R5383 Other fatigue: Secondary | ICD-10-CM

## 2020-07-17 DIAGNOSIS — E538 Deficiency of other specified B group vitamins: Secondary | ICD-10-CM | POA: Diagnosis not present

## 2020-07-17 DIAGNOSIS — R079 Chest pain, unspecified: Secondary | ICD-10-CM | POA: Diagnosis not present

## 2020-07-17 DIAGNOSIS — Z0000 Encounter for general adult medical examination without abnormal findings: Secondary | ICD-10-CM

## 2020-07-17 DIAGNOSIS — Z0001 Encounter for general adult medical examination with abnormal findings: Secondary | ICD-10-CM | POA: Diagnosis not present

## 2020-07-17 DIAGNOSIS — Z113 Encounter for screening for infections with a predominantly sexual mode of transmission: Secondary | ICD-10-CM | POA: Diagnosis not present

## 2020-07-17 LAB — COMPREHENSIVE METABOLIC PANEL
ALT: 18 U/L (ref 0–35)
AST: 20 U/L (ref 0–37)
Albumin: 4.3 g/dL (ref 3.5–5.2)
Alkaline Phosphatase: 74 U/L (ref 39–117)
BUN: 12 mg/dL (ref 6–23)
CO2: 28 mEq/L (ref 19–32)
Calcium: 9.4 mg/dL (ref 8.4–10.5)
Chloride: 105 mEq/L (ref 96–112)
Creatinine, Ser: 0.81 mg/dL (ref 0.40–1.20)
GFR: 82.56 mL/min (ref >60.00)
Glucose, Bld: 83 mg/dL (ref 70–99)
Potassium: 4.3 mEq/L (ref 3.5–5.1)
Sodium: 141 mEq/L (ref 135–145)
Total Bilirubin: 0.7 mg/dL (ref 0.2–1.2)
Total Protein: 7.4 g/dL (ref 6.0–8.3)

## 2020-07-17 LAB — LIPID PANEL
Cholesterol: 168 mg/dL (ref 0–200)
HDL: 50.9 mg/dL (ref >39.00)
LDL Cholesterol: 97 mg/dL (ref 0–99)
NonHDL: 117.38
Total CHOL/HDL Ratio: 3
Triglycerides: 103 mg/dL (ref 0.0–149.0)
VLDL: 20.6 mg/dL (ref 0.0–40.0)

## 2020-07-17 LAB — CBC WITH DIFFERENTIAL/PLATELET
Basophils Absolute: 0 10*3/uL (ref 0.0–0.1)
Basophils Relative: 0.6 % (ref 0.0–3.0)
Eosinophils Absolute: 0.1 10*3/uL (ref 0.0–0.7)
Eosinophils Relative: 1.9 % (ref 0.0–5.0)
HCT: 40.2 % (ref 36.0–46.0)
Hemoglobin: 13.2 g/dL (ref 12.0–15.0)
Lymphocytes Relative: 33.3 % (ref 12.0–46.0)
Lymphs Abs: 2 10*3/uL (ref 0.7–4.0)
MCHC: 32.9 g/dL (ref 30.0–36.0)
MCV: 90.1 fl (ref 78.0–100.0)
Monocytes Absolute: 0.4 10*3/uL (ref 0.1–1.0)
Monocytes Relative: 6.7 % (ref 3.0–12.0)
Neutro Abs: 3.5 10*3/uL (ref 1.4–7.7)
Neutrophils Relative %: 57.5 % (ref 43.0–77.0)
Platelets: 232 10*3/uL (ref 150.0–400.0)
RBC: 4.46 Mil/uL (ref 3.87–5.11)
RDW: 13.7 % (ref 11.5–15.5)
WBC: 6 10*3/uL (ref 4.0–10.5)

## 2020-07-17 LAB — VITAMIN B12: Vitamin B-12: >1550 pg/mL — ABNORMAL HIGH (ref 211–911)

## 2020-07-17 LAB — TROPONIN I (HIGH SENSITIVITY): High Sens Troponin I: 5 ng/L (ref 2–17)

## 2020-07-17 LAB — TSH: TSH: 1.18 u[IU]/mL (ref 0.35–4.50)

## 2020-07-17 LAB — T4, FREE: Free T4: 0.84 ng/dL (ref 0.60–1.60)

## 2020-07-17 MED ORDER — CYANOCOBALAMIN 1000 MCG/ML IJ SOLN: 1000.0000 ug | Freq: Once | INTRAMUSCULAR | 0 refills | Status: DC

## 2020-07-17 MED ORDER — CYANOCOBALAMIN 1000 MCG/ML IJ SOLN
1000.0000 ug | Freq: Once | INTRAMUSCULAR | Status: AC
  Administered 2020-07-17: 1000 ug via INTRAMUSCULAR

## 2020-07-17 NOTE — Progress Notes (Signed)
Subjective:    Patient ID: Erin Travis, female    DOB: [birth year], 55 y.o.   MRN: [id number]  HPI  Pt in for cpe/wellness exam.  Pt from [facility]. Works for [employer]. Pt does not exercise regularly. Walks a lot at work.   Pt getting mammogram next month.    Pt states she has random episodes of chest pain. Occurs at work sometimes and at home. She states mild activity at times clearing of house will feel this. Other times at work when walking will feel the pain at times as well.  Last time she had pain was yesterday. She can't specify how long. Pt states sometimes with this pain will have pain in left arm no known high cholesterol. Nonsmoker, no family of mi or strokes and no diabetes. Sometimes pain in left arm when has pain and mid sob.  Pt states sometimes has daily chest pain.  Pt states in past did go to ED years ago and work up was negative.  Pt has some daily stress related to single parenting 77 yo son.      Review of Systems  Constitutional: Positive for fatigue. Negative for chills and fever.       Hx of low b12.  HENT: Negative for congestion and drooling.   Respiratory: Negative for cough, chest tightness, shortness of breath and wheezing.   Cardiovascular: Negative for chest pain and palpitations.  Gastrointestinal: Negative for abdominal pain, constipation, nausea and vomiting.  Genitourinary: Negative for difficulty urinating, dysuria and frequency.  Musculoskeletal: Negative for back pain and gait problem.  Skin: Negative for rash.  Neurological: Negative for dizziness, seizures and light-headedness.  Hematological: Negative for adenopathy. Does not bruise/bleed easily.  Psychiatric/Behavioral: Negative for behavioral problems, confusion, hallucinations and suicidal ideas. The patient is not nervous/anxious.      Past Medical History:  Diagnosis Date  . Anxiety    no meds  . DUB (dysfunctional uterine bleeding)   . Endometriosis   .  Headache(784.0)   . Hemorrhoids   . Obesity (BMI 30-39.9)    BMI 30  . PONV (postoperative nausea and vomiting)   . Seizures ([place])    caused by fever as child - no seizures since childhood, no meds  . Wears glasses      Social History   Socioeconomic History  . Marital status: Divorced    Spouse name: Not on file  . Number of children: 2  . Years of education: Not on file  . Highest education level: 12th grade  Occupational History  . Not on file  Tobacco Use  . Smoking status: Never Smoker  . Smokeless tobacco: Never Used  Vaping Use  . Vaping Use: Never used  Substance and Sexual Activity  . Alcohol use: No  . Drug use: No  . Sexual activity: Never    Birth control/protection: Surgical  Other Topics Concern  . Not on file  Social History Narrative  . Not on file   Social Determinants of Health   Financial Resource Strain: Not on file  Food Insecurity: Not on file  Transportation Needs: Not on file  Physical Activity: Not on file  Stress: Not on file  Social Connections: Not on file  Intimate Partner Violence: Not on file    Past Surgical History:  Procedure Laterality Date  . ABDOMINAL HYSTERECTOMY    . ABDOMINAL HYSTERECTOMY    . BREAST EXCISIONAL BIOPSY Left   . BREAST LUMPECTOMY WITH NEEDLE LOCALIZATION Left 06/01/2013  Procedure: BREAST LUMPECTOMY WITH NEEDLE LOCALIZATION;  Surgeon: Odis Hollingshead, MD;  Location: [facility];  Service: General;  Laterality: Left;  . BREAST SURGERY    . CYSTOSCOPY  04/22/2011   Procedure: CYSTOSCOPY;  Surgeon: Myra C. Hulan Fray, MD;  Location: [facility];  Service: Gynecology;  Laterality: N/A;  . LAPAROSCOPY  11/2004  . MULTIPLE TOOTH EXTRACTIONS    . SVD     x 2  . TONSILLECTOMY AND ADENOIDECTOMY    . TUBAL LIGATION  07/2005  . WISDOM TOOTH EXTRACTION      Family History  Problem Relation Age of Onset  . Cancer Father        throat  . Lung disease Mother   . Cancer Paternal Grandmother        stomach   . Hypertension Brother     Allergies  Allergen Reactions  . Latex Itching    Vaginal itching and irritation  . Penicillins Itching    Has patient had a PCN reaction causing immediate rash, facial/tongue/throat swelling, SOB or lightheadedness with hypotension: Yes Has patient had a PCN reaction causing severe rash involving mucus membranes or skin necrosis: No Has patient had a PCN reaction that required hospitalization No Has patient had a PCN reaction occurring within the last 10 years: No If all of the above answers are "NO", then may proceed with Cephalosporin use.     No current outpatient medications on file prior to visit.   No current facility-administered medications on file prior to visit.    BP 129/74   Pulse 67   Resp 20   Ht 5\' 7"  (1.702 m)   Wt 222 lb (100.7 kg)   LMP 04/17/2011   SpO2 99%   BMI 34.77 kg/m       Objective:   Physical Exam  General Mental Status- Alert. General Appearance- Not in acute distress.   Skin General: Color- Normal Color. Moisture- Normal Moisture.  Neck Carotid Arteries- Normal color. Moisture- Normal Moisture. No carotid bruits. No JVD.  Chest and Lung Exam Auscultation: Breath Sounds:-Normal.  Cardiovascular Auscultation:Rythm- Regular. Murmurs & Other Heart Sounds:Auscultation of the heart reveals- No Murmurs.  Abdomen Inspection:-Inspeection Normal. Palpation/Percussion:Note:No mass. Palpation and Percussion of the abdomen reveal- Non Tender, Non Distended + BS, no rebound or guarding.   Neurologic Cranial Nerve exam:- CN III-XII intact(No nystagmus), symmetric smile. Strength:- 5/5 equal and symmetric strength both upper and lower extremities.      Assessment & Plan:  For you wellness exam today I have ordered cbc, cmp and lipid panel and hiv.  Pt declines tdap today. Can get later.  Recommend exercise and healthy diet.  We will let you know lab results as they come in.  Follow up date appointment  will be determined after lab review.   For chest pain hx we got one set troponin since no active pain/had some yesterday. Refer to cardiologist. If your pain returns have to advise ED evauation during interim.  For fatigue and low b12 hx we gave b12 injection. Also got b12, b1, vit d, tsh and t4 labs.  Percell Miller Berdell Hostetler, [place] charge as well since adressed chest pain, b12 vitamin deficiency hx. Placed referral to cardiologist, ekg done and stat onset troponin pending.

## 2020-07-17 NOTE — Patient Instructions (Addendum)
For you wellness exam today I have ordered cbc, cmp and lipid panel and hiv.  Pt declines tdap today. Can get later.  Recommend exercise and healthy diet.  We will let you know lab results as they come in.  Follow up date appointment will be determined after lab review.   For chest pain hx we got one set troponin since no active pain/had some yesterday. Refer to cardiologist. If your pain returns have to advise ED evauation during interim. ekg showed normal sinus rhythm.  For fatigue and low b12 hx we gave b12 injection. Also got b12, b1, vit d, tsh and t4 labs.   Preventive Care 28-33 Years Old, Female Preventive care refers to lifestyle choices and visits with your health care provider that can promote health and wellness. This includes:  A yearly physical exam. This is also called an annual wellness visit.  Regular dental and eye exams.  Immunizations.  Screening for certain conditions.  Healthy lifestyle choices, such as: ? Eating a healthy diet. ? Getting regular exercise. ? Not using drugs or products that contain nicotine and tobacco. ? Limiting alcohol use. What can I expect for my preventive care visit? Physical exam Your health care provider will check your:  Height and weight. These may be used to calculate your BMI (body mass index). BMI is a measurement that tells if you are at a healthy weight.  Heart rate and blood pressure.  Body temperature.  Skin for abnormal spots. Counseling Your health care provider may ask you questions about your:  Past medical problems.  Family's medical history.  Alcohol, tobacco, and drug use.  Emotional well-being.  Home life and relationship well-being.  Sexual activity.  Diet, exercise, and sleep habits.  Work and work Statistician.  Access to firearms.  Method of birth control.  Menstrual cycle.  Pregnancy history. What immunizations do I need? Vaccines are usually given at various ages, according to a  schedule. Your health care provider will recommend vaccines for you based on your age, medical history, and lifestyle or other factors, such as travel or where you work.   What tests do I need? Blood tests  Lipid and cholesterol levels. These may be checked every 5 years, or more often if you are over 30 years old.  Hepatitis C test.  Hepatitis B test. Screening  Lung cancer screening. You may have this screening every year starting at age 23 if you have a 30-pack-year history of smoking and currently smoke or have quit within the past 15 years.  Colorectal cancer screening. ? All adults should have this screening starting at age 88 and continuing until age 55. ? Your health care provider may recommend screening at age 9 if you are at increased risk. ? You will have tests every 1-10 years, depending on your results and the type of screening test.  Diabetes screening. ? This is done by checking your blood sugar (glucose) after you have not eaten for a while (fasting). ? You may have this done every 1-3 years.  Mammogram. ? This may be done every 1-2 years. ? Talk with your health care provider about when you should start having regular mammograms. This may depend on whether you have a family history of breast cancer.  BRCA-related cancer screening. This may be done if you have a family history of breast, ovarian, tubal, or peritoneal cancers.  Pelvic exam and Pap test. ? This may be done every 3 years starting at age 12. ? Starting  at age 29, this may be done every 5 years if you have a Pap test in combination with an HPV test. Other tests  STD (sexually transmitted disease) testing, if you are at risk.  Bone density scan. This is done to screen for osteoporosis. You may have this scan if you are at high risk for osteoporosis. Talk with your health care provider about your test results, treatment options, and if necessary, the need for more tests. Follow these instructions at  home: Eating and drinking  Eat a diet that includes fresh fruits and vegetables, whole grains, lean protein, and low-fat dairy products.  Take vitamin and mineral supplements as recommended by your health care provider.  Do not drink alcohol if: ? Your health care provider tells you not to drink. ? You are pregnant, may be pregnant, or are planning to become pregnant.  If you drink alcohol: ? Limit how much you have to 0-1 drink a day. ? Be aware of how much alcohol is in your drink. In the [place] one drink equals one 12 oz bottle of beer (355 mL), one 5 oz glass of wine (148 mL), or one 1 oz glass of hard liquor (44 mL).   Lifestyle  Take daily care of your teeth and gums. Brush your teeth every morning and night with fluoride toothpaste. Floss one time each day.  Stay active. Exercise for at least 30 minutes 5 or more days each week.  Do not use any products that contain nicotine or tobacco, such as cigarettes, e-cigarettes, and chewing tobacco. If you need help quitting, ask your health care provider.  Do not use drugs.  If you are sexually active, practice safe sex. Use a condom or other form of protection to prevent STIs (sexually transmitted infections).  If you do not wish to become pregnant, use a form of birth control. If you plan to become pregnant, see your health care provider for a prepregnancy visit.  If told by your health care provider, take low-dose aspirin daily starting at age 70.  Find healthy ways to cope with stress, such as: ? Meditation, yoga, or listening to music. ? Journaling. ? Talking to a trusted person. ? Spending time with friends and family. Safety  Always wear your seat belt while driving or riding in a vehicle.  Do not drive: ? If you have been drinking alcohol. Do not ride with someone who has been drinking. ? When you are tired or distracted. ? While texting.  Wear a helmet and other protective equipment during sports activities.  If  you have firearms in your house, make sure you follow all gun safety procedures. What's next?  Visit your health care provider once a year for an annual wellness visit.  Ask your health care provider how often you should have your eyes and teeth checked.  Stay up to date on all vaccines. This information is not intended to replace advice given to you by your health care provider. Make sure you discuss any questions you have with your health care provider. Document Revised: 11/16/2019 Document Reviewed: 10/23/2017 Elsevier Patient Education  2021 [employer].

## 2020-07-17 NOTE — Addendum Note (Signed)
Addended by: Jeronimo Greaves on: 07/17/2020 11:14 AM   Modules accepted: Orders

## 2020-07-20 LAB — VITAMIN B1: Vitamin B1 (Thiamine): 11 nmol/L (ref 8–30)

## 2020-07-21 DIAGNOSIS — R112 Nausea with vomiting, unspecified: Secondary | ICD-10-CM | POA: Insufficient documentation

## 2020-07-21 DIAGNOSIS — N938 Other specified abnormal uterine and vaginal bleeding: Secondary | ICD-10-CM | POA: Insufficient documentation

## 2020-07-21 DIAGNOSIS — E669 Obesity, unspecified: Secondary | ICD-10-CM | POA: Insufficient documentation

## 2020-07-21 DIAGNOSIS — N809 Endometriosis, unspecified: Secondary | ICD-10-CM | POA: Insufficient documentation

## 2020-07-21 DIAGNOSIS — R569 Unspecified convulsions: Secondary | ICD-10-CM | POA: Insufficient documentation

## 2020-07-21 DIAGNOSIS — K649 Unspecified hemorrhoids: Secondary | ICD-10-CM | POA: Insufficient documentation

## 2020-07-21 DIAGNOSIS — R519 Headache, unspecified: Secondary | ICD-10-CM | POA: Insufficient documentation

## 2020-07-21 DIAGNOSIS — F419 Anxiety disorder, unspecified: Secondary | ICD-10-CM | POA: Insufficient documentation

## 2020-07-21 DIAGNOSIS — Z973 Presence of spectacles and contact lenses: Secondary | ICD-10-CM | POA: Insufficient documentation

## 2020-07-21 DIAGNOSIS — Z9889 Other specified postprocedural states: Secondary | ICD-10-CM | POA: Insufficient documentation

## 2020-07-21 LAB — VITAMIN D 1,25 DIHYDROXY
Vitamin D 1, 25 (OH)2 Total: 36 pg/mL (ref 18–72)
Vitamin D2 1, 25 (OH)2: <8 pg/mL
Vitamin D3 1, 25 (OH)2: 36 pg/mL

## 2020-07-21 LAB — HIV ANTIBODY (ROUTINE TESTING W REFLEX): HIV 1&2 Ab, 4th Generation: NONREACTIVE

## 2020-07-25 ENCOUNTER — Ambulatory Visit (INDEPENDENT_AMBULATORY_CARE_PROVIDER_SITE_OTHER): Payer: BLUE CROSS/BLUE SHIELD | Admitting: Cardiology

## 2020-07-25 ENCOUNTER — Encounter: Payer: Self-pay | Admitting: Cardiology

## 2020-07-25 ENCOUNTER — Other Ambulatory Visit: Payer: Self-pay

## 2020-07-25 VITALS — BP 132/94 | HR 80 | Ht 67.6 in | Wt 221.1 lb

## 2020-07-25 DIAGNOSIS — E669 Obesity, unspecified: Secondary | ICD-10-CM

## 2020-07-25 DIAGNOSIS — E66811 Obesity, class 1: Secondary | ICD-10-CM

## 2020-07-25 DIAGNOSIS — I209 Angina pectoris, unspecified: Secondary | ICD-10-CM

## 2020-07-25 DIAGNOSIS — I259 Chronic ischemic heart disease, unspecified: Secondary | ICD-10-CM | POA: Diagnosis not present

## 2020-07-25 HISTORY — DX: Angina pectoris, unspecified: I20.9

## 2020-07-25 HISTORY — DX: Obesity, class 1: E66.811

## 2020-07-25 HISTORY — DX: Obesity, unspecified: E66.9

## 2020-07-25 MED ORDER — NITROGLYCERIN 0.4 MG SL SUBL: 0.4000 mg | SUBLINGUAL_TABLET | SUBLINGUAL | 6 refills | Status: DC | PRN

## 2020-07-25 MED ORDER — ASPIRIN EC 81 MG PO TBEC: 81.0000 mg | DELAYED_RELEASE_TABLET | Freq: Every day | ORAL | 3 refills | Status: AC

## 2020-07-25 NOTE — Patient Instructions (Signed)
Medication Instructions:  Your physician has recommended you make the following change in your medication:   Take 81 mg coated aspirin daily.  *If you need a refill on your cardiac medications before your next appointment, please call your pharmacy*   Lab Work: None ordered If you have labs (blood work) drawn today and your tests are completely normal, you will receive your results only by: Marland Kitchen MyChart Message (if you have MyChart) OR . A paper copy in the mail If you have any lab test that is abnormal or we need to change your treatment, we will call you to review the results.   Testing/Procedures: Your physician has requested that you have a lexiscan myoview. For further information please visit [email or web address]. Please follow instruction sheet, as given.  The test will take approximately 3 to 4 hours to complete; you may bring reading material.  If someone comes with you to your appointment, they will need to remain in the main lobby due to limited space in the testing area. **If you are pregnant or breastfeeding, please notify the nuclear lab prior to your appointment**  How to prepare for your Myocardial Perfusion Test: . Do not eat or drink 3 hours prior to your test, except you may have water. . Do not consume products containing caffeine (regular or decaffeinated) 12 hours prior to your test. (ex: coffee, chocolate, sodas, tea). . Do bring a list of your current medications with you.  If not listed below, you may take your medications as normal. . Do wear comfortable clothes (no dresses or overalls) and walking shoes, tennis shoes preferred (No heels or open toe shoes are allowed). . Do NOT wear cologne, perfume, aftershave, or lotions (deodorant is allowed). . If these instructions are not followed, your test will have to be rescheduled.    Follow-Up: At [facility], you and your health needs are our priority.  As part of our continuing mission to provide you with  exceptional heart care, we have created designated Provider Care Teams.  These Care Teams include your primary Cardiologist (physician) and Advanced Practice Providers (APPs -  Physician Assistants and Nurse Practitioners) who all work together to provide you with the care you need, when you need it.  We recommend signing up for the patient portal called "MyChart".  Sign up information is provided on this After Visit Summary.  MyChart is used to connect with patients for Virtual Visits (Telemedicine).  Patients are able to view lab/test results, encounter notes, upcoming appointments, etc.  Non-urgent messages can be sent to your provider as well.   To learn more about what you can do with MyChart, go to [email or web address].    Your next appointment:   2 month(s)  The format for your next appointment:   In Person  Provider:   Jyl Heinz, MD   Other Instructions Cardiac Nuclear Scan A cardiac nuclear scan is a test that is done to check the flow of blood to your heart. It is done when you are resting and when you are exercising. The test looks for problems such as:  Not enough blood reaching a portion of the heart.  The heart muscle not working as it should. You may need this test if:  You have heart disease.  You have had lab results that are not normal.  You have had heart surgery or a balloon procedure to open up blocked arteries (angioplasty).  You have chest pain.  You have shortness of breath. In this test, a  special dye (tracer) is put into your bloodstream. The tracer will travel to your heart. A camera will then take pictures of your heart to see how the tracer moves through your heart. This test is usually done at a hospital and takes 2-4 hours. Tell a doctor about:  Any allergies you have.  All medicines you are taking, including vitamins, herbs, eye drops, creams, and over-the-counter medicines.  Any problems you or family members have had with anesthetic  medicines.  Any blood disorders you have.  Any surgeries you have had.  Any medical conditions you have.  Whether you are pregnant or may be pregnant. What are the risks? Generally, this is a safe test. However, problems may occur, such as:  Serious chest pain and heart attack. This is only a risk if the stress portion of the test is done.  Rapid heartbeat.  A feeling of warmth in your chest. This feeling usually does not last long.  Allergic reaction to the tracer. What happens before the test?  Ask your doctor about changing or stopping your normal medicines. This is important.  Follow instructions from your doctor about what you cannot eat or drink.  Remove your jewelry on the day of the test. What happens during the test? 1. An IV tube will be inserted into one of your veins. 2. Your doctor will give you a small amount of tracer through the IV tube. 3. You will wait for 20-40 minutes while the tracer moves through your bloodstream. 4. Your heart will be monitored with an electrocardiogram (ECG). 5. You will lie down on an exam table. 6. Pictures of your heart will be taken for about 15-20 minutes. 7. You may also have a stress test. For this test, one of these things may be done: ? You will be asked to exercise on a treadmill or a stationary bike. ? You will be given medicines that will make your heart work harder. This is done if you are unable to exercise. 8. When blood flow to your heart has peaked, a tracer will again be given through the IV tube. 9. After 20-40 minutes, you will get back on the exam table. More pictures will be taken of your heart. 10. Depending on the tracer that is used, more pictures may need to be taken 3-4 hours later. 11. Your IV tube will be removed when the test is over. The test may vary among doctors and hospitals. What happens after the test? 1. Ask your doctor: ? Whether you can return to your normal schedule, including diet,  activities, and medicines. ? Whether you should drink more fluids. This will help to remove the tracer from your body. Drink enough fluid to keep your pee (urine) pale yellow. 2. Ask your doctor, or the department that is doing the test: ? When will my results be ready? ? How will I get my results? Summary  A cardiac nuclear scan is a test that is done to check the flow of blood to your heart.  Tell your doctor whether you are pregnant or may be pregnant.  Before the test, ask your doctor about changing or stopping your normal medicines. This is important.  Ask your doctor whether you can return to your normal activities. You may be asked to drink more fluids. This information is not intended to replace advice given to you by your health care provider. Make sure you discuss any questions you have with your health care provider. Document Revised: 06/03/2018 Document  Reviewed: 07/28/2017 Elsevier Patient Education  2021 [place].   Aspirin and Your Heart Aspirin is a medicine that prevents the platelets in your blood from sticking together. Platelets are the cells that your blood uses for clotting. Aspirin can be used to help reduce the risk of blood clots, heart attacks, and other heart-related problems. What are the risks? Daily use of aspirin can cause side effects. Some of these include:  Bleeding. Bleeding can be minor or serious. An example of minor bleeding is bleeding from a cut, and the bleeding does not stop. An example of more serious bleeding is stomach bleeding or, rarely, bleeding into the brain. Your risk of bleeding increases if you are also taking NSAIDs, such as ibuprofen.  Increased bruising.  Upset stomach.  An allergic reaction. People who have growths inside the nose (nasal polyps) have an increased risk of developing an aspirin allergy. How to use aspirin to care for your heart  Take aspirin only as told by your health care provider. Make sure that you  understand how much to take and what form to take. The two forms of aspirin are: ? Non-enteric-coated.This type of aspirin does not have a coating and is absorbed quickly. This type of aspirin also comes in a chewable form. ? Enteric-coated. This type of aspirin has a coating that releases the medicine very slowly. Enteric-coated aspirin might cause less stomach upset than non-enteric-coated aspirin. This type of aspirin should not be chewed or crushed.  Work with your health care provider to find out whether it is safe and beneficial for you to take aspirin daily. Taking aspirin daily may be helpful if: ? You have had a heart attack or chest pain, or you are at risk for a heart attack. ? You have a condition in which certain heart vessels are blocked (coronary artery disease), and you have had a procedure to treat it. Examples are:  Open-heart surgery, such as coronary artery bypass surgery (CABG).  Coronary angioplasty,which is done to widen a blood vessel of your heart.  Having a small mesh tube, or stent, placed in your coronary artery. ? You have had certain types of stroke or a mini-stroke known as a transient ischemic attack (TIA). ? You have a narrowing of the arteries that supply the limbs (peripheral artery disease, or PAD). ? You have long-term (chronic) heart rhythm problems, such as atrial fibrillation, and your health care provider thinks aspirin may help. ? You have valve disease or have had surgery on a valve. ? You are considered at increased risk of developing coronary artery disease or PAD.   Follow these instructions at home Medicines  Take over-the-counter and prescription medicines only as told by your health care provider.  If you are taking blood thinners: ? Talk with your health care provider before you take any medicines that contain aspirin or NSAIDs, such as ibuprofen. These medicines increase your risk for dangerous bleeding. ? Take your medicine exactly as told,  at the same time every day. ? Avoid activities that could cause injury or bruising, and follow instructions about how to prevent falls. ? Wear a medical alert bracelet or carry a card that lists what medicines you take. General instructions  Do not drink alcohol if: ? Your health care provider tells you not to drink. ? You are pregnant, may be pregnant, or are planning to become pregnant.  If you drink alcohol: ? Limit how much you use to:  0-1 drink a day for women.  0-2 drinks a day for men. ? Be aware of how much alcohol is in your drink. In the [place] one drink equals one 12 oz bottle of beer (355 mL), one 5 oz glass of wine (148 mL), or one 1 oz glass of hard liquor (44 mL).  Keep all follow-up visits as told by your health care provider. This is important. Where to find more information  The American Heart Association: www.heart.org Contact a health care provider if you have:  Unusual bleeding or bruising.  Stomach pain or nausea.  Ringing in your ears.  An allergic reaction that causes hives, itchy skin, or swelling of the lips, tongue, or face. Get help right away if:  You notice that your bowel movements are bloody, or dark red or black in color.  You vomit or cough up blood.  You have blood in your urine.  You cough, breathe loudly (wheeze), or feel short of breath.  You have chest pain, especially if the pain spreads to your arms, back, neck, or jaw.  You have a headache with confusion. You have any symptoms of a stroke. "BE FAST" is an easy way to remember the main warning signs of a stroke:  B - Balance. Signs are dizziness, sudden trouble walking, or loss of balance.  E - Eyes. Signs are trouble seeing or a sudden change in vision.  F - Face. Signs are sudden weakness or numbness of the face, or the face or eyelid drooping on one side.  A - Arms. Signs are weakness or numbness in an arm. This happens suddenly and usually on one side of the body.  S -  Speech. Signs are sudden trouble speaking, slurred speech, or trouble understanding what people say.  T - Time. Time to call emergency services. Write down what time symptoms started. You have other signs of a stroke, such as:  A sudden, severe headache with no known cause.  Nausea or vomiting.  Seizure. These symptoms may represent a serious problem that is an emergency. Do not wait to see if the symptoms will go away. Get medical help right away. Call your local emergency services (911 in the [place] Do not drive yourself to the hospital. Summary  Aspirin use can help reduce the risk of blood clots, heart attacks, and other heart-related problems.  Daily use of aspirin can cause side effects.  Take aspirin only as told by your health care provider. Make sure that you understand how much to take and what form to take.  Your health care provider will help you determine whether it is safe and beneficial for you to take aspirin daily. This information is not intended to replace advice given to you by your health care provider. Make sure you discuss any questions you have with your health care provider. Document Revised: 11/16/2018 Document Reviewed: 11/16/2018 Elsevier Patient Education  2021 [place].  Nitroglycerin sublingual tablets What is this medicine? NITROGLYCERIN (nye troe GLI ser in) is a type of vasodilator. It relaxes blood vessels, increasing the blood and oxygen supply to your heart. This medicine is used to relieve chest pain caused by angina. It is also used to prevent chest pain before activities like climbing stairs, going outdoors in cold weather, or sexual activity. This medicine may be used for other purposes; ask your health care provider or pharmacist if you have questions. COMMON BRAND NAME(S): Nitroquick, Nitrostat, Nitrotab What should I tell my health care provider before I take this medicine? They need to  know if you have any of these  conditions:  anemia  head injury, recent stroke, or bleeding in the brain  liver disease  previous heart attack  an unusual or allergic reaction to nitroglycerin, other medicines, foods, dyes, or preservatives  pregnant or trying to get pregnant  breast-feeding How should I use this medicine? Take this medicine by mouth as needed. Use at the first sign of an angina attack (chest pain or tightness). You can also take this medicine 5 to 10 minutes before an event likely to produce chest pain. Follow the directions exactly as written on the prescription label. Place one tablet under your tongue and let it dissolve. Do not swallow whole. Replace the dose if you accidentally swallow it. It will help if your mouth is not dry. Saliva around the tablet will help it to dissolve more quickly. Do not eat or drink, smoke or chew tobacco while a tablet is dissolving. Sit down when taking this medicine. In an angina attack, you should feel better within 5 minutes after your first dose. You can take a dose every 5 minutes up to a total of 3 doses. If you do not feel better or feel worse after 1 dose, call 9-1-1 at once. Do not take more than 3 doses in 15 minutes. Your health care provider might give you other directions. Follow those directions if he or she does. Do not take your medicine more often than directed. Talk to your health care provider about the use of this medicine in children. Special care may be needed. Overdosage: If you think you have taken too much of this medicine contact a poison control center or emergency room at once. NOTE: This medicine is only for you. Do not share this medicine with others. What if I miss a dose? This does not apply. This medicine is only used as needed. What may interact with this medicine? Do not take this medicine with any of the following medications:  certain migraine medicines like ergotamine and dihydroergotamine (DHE)  medicines used to treat erectile  dysfunction like sildenafil, tadalafil, and vardenafil  riociguat This medicine may also interact with the following medications:  alteplase  aspirin  heparin  medicines for high blood pressure  medicines for mental depression  other medicines used to treat angina  phenothiazines like chlorpromazine, mesoridazine, prochlorperazine, thioridazine This list may not describe all possible interactions. Give your health care provider a list of all the medicines, herbs, non-prescription drugs, or dietary supplements you use. Also tell them if you smoke, drink alcohol, or use illegal drugs. Some items may interact with your medicine. What should I watch for while using this medicine? Tell your doctor or health care professional if you feel your medicine is no longer working. Keep this medicine with you at all times. Sit or lie down when you take your medicine to prevent falling if you feel dizzy or faint after using it. Try to remain calm. This will help you to feel better faster. If you feel dizzy, take several deep breaths and lie down with your feet propped up, or bend forward with your head resting between your knees. You may get drowsy or dizzy. Do not drive, use machinery, or do anything that needs mental alertness until you know how this drug affects you. Do not stand or sit up quickly, especially if you are an older patient. This reduces the risk of dizzy or fainting spells. Alcohol can make you more drowsy and dizzy. Avoid alcoholic drinks.  Do not treat yourself for coughs, colds, or pain while you are taking this medicine without asking your doctor or health care professional for advice. Some ingredients may increase your blood pressure. What side effects may I notice from receiving this medicine? Side effects that you should report to your doctor or health care professional as soon as possible:  allergic reactions (skin rash, itching or hives; swelling of the face, lips, or  tongue)  low blood pressure (dizziness; feeling faint or lightheaded, falls; unusually weak or tired)  low red blood cell counts (trouble breathing; feeling faint; lightheaded, falls; unusually weak or tired) Side effects that usually do not require medical attention (report to your doctor or health care professional if they continue or are bothersome):  facial flushing (redness)  headache  nausea, vomiting This list may not describe all possible side effects. Call your doctor for medical advice about side effects. You may report side effects to FDA at 1-800-FDA-1088. Where should I keep my medicine? Keep out of the reach of children. Store at room temperature between 20 and 25 degrees C (68 and 77 degrees F). Store in [employer]. Protect from light and moisture. Keep tightly closed. Throw away any unused medicine after the expiration date. NOTE: This sheet is a summary. It may not cover all possible information. If you have questions about this medicine, talk to your doctor, pharmacist, or health care provider.  2021 Elsevier/Gold Standard (2017-11-12 16:46:32)

## 2020-07-25 NOTE — Progress Notes (Signed)
Cardiology Office Note:    Date:  07/25/2020   ID:  Erin Travis, DOB 1965-11-09, MRN [id number]  PCP:  Mackie Pai, PA-C  Cardiologist:  Jenean Lindau, MD   Referring MD: Mackie Pai, PA-C    ASSESSMENT:    1. Angina pectoris ([place])   2. Chest pain due to myocardial ischemia, unspecified ischemic chest pain type   3. Obesity (BMI 30.0-34.9)    PLAN:    In order of problems listed above:  1. Primary prevention stressed with the patient.  Importance of compliance with diet medication stressed and she vocalized understanding. 2. Angina pectoris: Her symptoms are concerning.  They suggest angina pectoris.  In view of this following recommendations were made.  Sublingual nitroglycerin prescription was sent, its protocol and 911 protocol explained and the patient vocalized understanding questions were answered to the patient's satisfaction.  She was advised to take a coated baby aspirin on a daily basis.  I advised the patient about invasive and noninvasive evaluation and she preferred stress testing we will do exercise stress Cardiolite.  Benefits and potential is explained and she vocalized understanding.  She knows to go to the nearest emergency room for any concerning symptoms. 3. Mild dyslipidemia: I discussed my findings and diet was emphasized. 4. Obesity: I discussed this with her at length.  Weight reduction suggested.  Risks of obesity emphasized and she promises to do better. 5. Patient will be seen in follow-up appointment in 6 months or earlier if the patient has any concerns    Medication Adjustments/Labs and Tests Ordered: Current medicines are reviewed at length with the patient today.  Concerns regarding medicines are outlined above.  Orders Placed This Encounter  Procedures  . MYOCARDIAL PERFUSION IMAGING  . EKG 12-Lead   Meds ordered this encounter  Medications  . aspirin EC 81 MG tablet    Sig: Take 1 tablet (81 mg total) by mouth daily. Swallow  whole.    Dispense:  90 tablet    Refill:  3  . nitroGLYCERIN (NITROSTAT) 0.4 MG SL tablet    Sig: Place 1 tablet (0.4 mg total) under the tongue every 5 (five) minutes as needed.    Dispense:  25 tablet    Refill:  6     History of Present Illness:    Erin Travis is a 55 y.o. female who is being seen today for the evaluation of chest pain at the request of Erin Travis, [place].  Patient is a pleasant 54 year old female.  She has past medical history that is not much significant.  Patient mentions to me that it with exertion she has some chest tightness now radiating to the neck into the arm.  No orthopnea or PND.  She does not have history of hypertension dyslipidemia or diabetes mellitus.  She is overweight.  She does not exercise on a regular basis.  She is not sexually active.  At the time of my evaluation, the patient is alert awake oriented and in no distress.  Past Medical History:  Diagnosis Date  . Anxiety    no meds  . DUB (dysfunctional uterine bleeding)   . Endometriosis   . Family history of throat cancer 09/10/2012  . 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 Headache   . Headache(784.0)   . Hemorrhoids   . Hemorrhoids   . Hyperglycemia 09/10/2012  . Nephrolithiasis 08/27/2012  . Obesity (BMI 30-39.9)    BMI 30  . PONV (postoperative nausea  and vomiting)   . PONV (postoperative nausea and vomiting)   . Seizures ([place])    caused by fever as child - no seizures since childhood, no meds  . Wears glasses     Past Surgical History:  Procedure Laterality Date  . ABDOMINAL HYSTERECTOMY    . ABDOMINAL HYSTERECTOMY    . BREAST EXCISIONAL BIOPSY Left   . BREAST LUMPECTOMY WITH NEEDLE LOCALIZATION Left 06/01/2013   Procedure: BREAST LUMPECTOMY WITH NEEDLE LOCALIZATION;  Surgeon: Odis Hollingshead, MD;  Location: [facility];  Service: General;  Laterality: Left;  . BREAST SURGERY    . CYSTOSCOPY  04/22/2011   Procedure: CYSTOSCOPY;   Surgeon: Myra C. Hulan Fray, MD;  Location: [facility];  Service: Gynecology;  Laterality: N/A;  . LAPAROSCOPY  11/2004  . MULTIPLE TOOTH EXTRACTIONS    . SVD     x 2  . TONSILLECTOMY AND ADENOIDECTOMY    . TUBAL LIGATION  07/2005  . WISDOM TOOTH EXTRACTION      Current Medications: Current Meds  Medication Sig  . aspirin EC 81 MG tablet Take 1 tablet (81 mg total) by mouth daily. Swallow whole.  . nitroGLYCERIN (NITROSTAT) 0.4 MG SL tablet Place 1 tablet (0.4 mg total) under the tongue every 5 (five) minutes as needed.     Allergies:   Latex and Penicillins   Social History   Socioeconomic History  . Marital status: Divorced    Spouse name: Not on file  . Number of children: 2  . Years of education: Not on file  . Highest education level: 12th grade  Occupational History  . Not on file  Tobacco Use  . Smoking status: Never Smoker  . Smokeless tobacco: Never Used  Vaping Use  . Vaping Use: Never used  Substance and Sexual Activity  . Alcohol use: No  . Drug use: No  . Sexual activity: Never    Birth control/protection: Surgical  Other Topics Concern  . Not on file  Social History Narrative  . Not on file   Social Determinants of Health   Financial Resource Strain: Not on file  Food Insecurity: Not on file  Transportation Needs: Not on file  Physical Activity: Not on file  Stress: Not on file  Social Connections: Not on file     Family History: The patient's family history includes Cancer in her father and paternal grandmother; Hypertension in her brother; Lung disease in her mother.  ROS:   Please see the history of present illness.    All other systems reviewed and are negative.  EKGs/Labs/Other Studies Reviewed:    The following studies were reviewed today: EKG reveals sinus rhythm and nonspecific ST-T changes   Recent Labs: 07/17/2020: ALT 18; BUN 12; Creatinine, Ser 0.81; Hemoglobin 13.2; Platelets 232.0; Potassium 4.3; Sodium 141; TSH 1.18  Recent Lipid  Panel    Component Value Date/Time   CHOL 168 07/17/2020 1121   TRIG 103.0 07/17/2020 1121   HDL 50.90 07/17/2020 1121   CHOLHDL 3 07/17/2020 1121   VLDL 20.6 07/17/2020 1121   LDLCALC 97 07/17/2020 1121    Physical Exam:    VS:  BP (!) 132/94   Pulse 80   Ht 5' 7.6" (1.717 m)   Wt 221 lb 1.9 oz (100.3 kg)   LMP 04/17/2011   SpO2 97%   BMI 34.02 kg/m     Wt Readings from Last 3 Encounters:  07/25/20 221 lb 1.9 oz (100.3 kg)  07/17/20 222 lb (  100.7 kg)  06/16/20 225 lb (102.1 kg)     GEN: Patient is in no acute distress HEENT: Normal NECK: No JVD; No carotid bruits LYMPHATICS: No lymphadenopathy CARDIAC: S1 S2 regular, 2/6 systolic murmur at the apex. RESPIRATORY:  Clear to auscultation without rales, wheezing or rhonchi  ABDOMEN: Soft, non-tender, non-distended MUSCULOSKELETAL:  No edema; No deformity  SKIN: Warm and dry NEUROLOGIC:  Alert and oriented x 3 PSYCHIATRIC:  Normal affect    Signed, Jenean Lindau, MD  07/25/2020 3:01 PM    [facility]

## 2020-08-14 ENCOUNTER — Other Ambulatory Visit: Payer: Self-pay

## 2020-08-14 ENCOUNTER — Ambulatory Visit
Admission: RE | Admit: 2020-08-14 | Discharge: 2020-08-14 | Disposition: A | Discharge status: Home / Self Care | Payer: BC Managed Care – PPO | Source: Ambulatory Visit | Attending: Medical | Admitting: Medical

## 2020-08-14 DIAGNOSIS — Z1231 Encounter for screening mammogram for malignant neoplasm of breast: Secondary | ICD-10-CM

## 2020-08-15 ENCOUNTER — Telehealth (HOSPITAL_COMMUNITY): Payer: Self-pay | Admitting: *Deleted

## 2020-08-15 NOTE — Telephone Encounter (Signed)
Left message on voicemail per DPR in reference to upcoming appointment scheduled on 08/21/20 at 7:30 with detailed instructions given per Myocardial Perfusion Study Information Sheet for the test. LM to arrive 15 minutes early, and that it is imperative to arrive on time for appointment to keep from having the test rescheduled. If you need to cancel or reschedule your appointment, please call the office within 24 hours of your appointment. Failure to do so may result in a cancellation of your appointment, and a $50 no show fee. Phone number given for call back for any questions.

## 2020-08-17 ENCOUNTER — Ambulatory Visit: Payer: BLUE CROSS/BLUE SHIELD

## 2020-08-21 ENCOUNTER — Other Ambulatory Visit: Payer: Self-pay

## 2020-08-21 ENCOUNTER — Ambulatory Visit (HOSPITAL_COMMUNITY): Payer: BC Managed Care – PPO | Attending: Cardiology

## 2020-08-21 DIAGNOSIS — I209 Angina pectoris, unspecified: Secondary | ICD-10-CM | POA: Diagnosis present

## 2020-08-21 DIAGNOSIS — I259 Chronic ischemic heart disease, unspecified: Secondary | ICD-10-CM | POA: Insufficient documentation

## 2020-08-21 LAB — MYOCARDIAL PERFUSION IMAGING
Estimated workload: 7 METS
Exercise duration (min): 6 min
Exercise duration (sec): 0 s
LV dias vol: 73 mL (ref 46–106)
LV sys vol: 31 mL
MPHR: 166 {beats}/min
Peak HR: 144 {beats}/min
Percent HR: 86 %
Rest HR: 66 {beats}/min
SDS: 1
SRS: 1
SSS: 2
TID: 0.85

## 2020-08-21 MED ORDER — TECHNETIUM TC 99M TETROFOSMIN IV KIT
9.0000 | PACK | Freq: Once | INTRAVENOUS | Status: AC | PRN
Start: 2020-08-21 — End: 2020-08-21
  Administered 2020-08-21: 9 via INTRAVENOUS
  Filled 2020-08-21: qty 9

## 2020-08-21 MED ORDER — TECHNETIUM TC 99M TETROFOSMIN IV KIT
32.4000 | PACK | Freq: Once | INTRAVENOUS | Status: AC | PRN
  Administered 2020-08-21: 32.4 via INTRAVENOUS
  Filled 2020-08-21: qty 33

## 2020-09-15 ENCOUNTER — Other Ambulatory Visit: Payer: Self-pay

## 2020-09-18 ENCOUNTER — Other Ambulatory Visit: Payer: Self-pay

## 2020-09-18 ENCOUNTER — Encounter: Payer: Self-pay | Admitting: Cardiology

## 2020-09-18 ENCOUNTER — Ambulatory Visit: Payer: BC Managed Care – PPO | Admitting: Cardiology

## 2020-09-18 VITALS — BP 128/68 | HR 60 | Ht 67.6 in | Wt 221.0 lb

## 2020-09-18 DIAGNOSIS — I209 Angina pectoris, unspecified: Secondary | ICD-10-CM | POA: Diagnosis not present

## 2020-09-18 DIAGNOSIS — I259 Chronic ischemic heart disease, unspecified: Secondary | ICD-10-CM | POA: Diagnosis not present

## 2020-09-18 DIAGNOSIS — E669 Obesity, unspecified: Secondary | ICD-10-CM | POA: Diagnosis not present

## 2020-09-18 NOTE — Progress Notes (Signed)
Cardiology Office Note:    Date:  09/18/2020   ID:  [name], DOB 1965-08-01, MRN [id number]  PCP:  Mackie Pai, PA-C  Cardiologist:  Jenean Lindau, MD   Referring MD: Mackie Pai, PA-C    ASSESSMENT:    1. Angina pectoris ([name])   2. Obesity (BMI 30-39.9)    PLAN:    In order of problems listed above:  Angina pectoris: I discussed my findings with the patient at extensive length.  Her symptoms are concerning.  She was advised to use nitroglycerin as needed basis.  She is taking aspirin daily.  Her stress test results are fine.  Her symptoms are concerning and I discussed with her invasive and noninvasive coronary angiography and she prefers CT coronary angiography with FFR and we will set it up for her.  Further recommendations will be made based on the findings of this test.  In the interim if she has significant symptoms she knows to go to the nearest emergency room. Obesity: Weight reduction was stressed.  Diet was emphasized and she promises to do better. She will be seen in follow-up appointment after coronary angiography.  She had multiple questions which were answered to her satisfaction.   Medication Adjustments/Labs and Tests Ordered: Current medicines are reviewed at length with the patient today.  Concerns regarding medicines are outlined above.  No orders of the defined types were placed in this encounter.  No orders of the defined types were placed in this encounter.    No chief complaint on file.    History of Present Illness:    Erin Travis is a 55 y.o. female.  Patient has past medical history significant.  She was evaluated for chest pain.  This was very concerning and she underwent stress testing and walked for 6 minutes on the treadmill.  This did not reveal any evidence of ischemia.  Patient is here for follow-up.  She works at [employer] and has a physical job.  She mentions to me that she has chest tightness on exertion and she rests.   She is used nitroglycerin once with relief of her symptoms.  At the time of my evaluation, the patient is alert awake oriented and in no distress.  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 04/30/2013   Headache    Hemorrhoids    Hyperglycemia 09/10/2012   Nephrolithiasis 08/27/2012   Obesity (BMI 30-39.9)    BMI 30   Obesity (BMI 30.0-34.9) 07/25/2020   PONV (postoperative nausea and vomiting)    PONV (postoperative nausea and vomiting)    Seizures ([place])    caused by fever as child - no seizures since childhood, no meds   Wears glasses     Past Surgical History:  Procedure Laterality Date   ABDOMINAL HYSTERECTOMY     ABDOMINAL HYSTERECTOMY     BREAST EXCISIONAL BIOPSY Left    BREAST LUMPECTOMY WITH NEEDLE LOCALIZATION Left 06/01/2013   Procedure: BREAST LUMPECTOMY WITH NEEDLE LOCALIZATION;  Surgeon: Odis Hollingshead, MD;  Location: [facility];  Service: General;  Laterality: Left;   BREAST SURGERY     CYSTOSCOPY  04/22/2011   Procedure: CYSTOSCOPY;  Surgeon: Myra C. Hulan Fray, MD;  Location: [facility];  Service: Gynecology;  Laterality:  N/A;   LAPAROSCOPY  11/2004   MULTIPLE TOOTH EXTRACTIONS     SVD     x 2   TONSILLECTOMY AND ADENOIDECTOMY     TUBAL LIGATION  07/2005   WISDOM TOOTH EXTRACTION      Current Medications: Current Meds  Medication Sig   aspirin EC 81 MG tablet Take 1 tablet (81 mg total) by mouth daily. Swallow whole.     Allergies:   Latex and Penicillins   Social History   Socioeconomic History   Marital status: Divorced    Spouse name: Not on file   Number of children: 2   Years of education: Not on file   Highest education level: 12th grade  Occupational History   Not on file  Tobacco Use   Smoking status: Never   Smokeless  tobacco: Never  Vaping Use   Vaping Use: Never used  Substance and Sexual Activity   Alcohol use: No   Drug use: No   Sexual activity: Never    Birth control/protection: Surgical  Other Topics Concern   Not on file  Social History Narrative   Not on file   Social Determinants of Health   Financial Resource Strain: Not on file  Food Insecurity: Not on file  Transportation Needs: Not on file  Physical Activity: Not on file  Stress: Not on file  Social Connections: Not on file     Family History: The patient's family history includes Cancer in her father and paternal grandmother; Hypertension in her brother; Lung disease in her mother.  ROS:   Please see the history of present illness.    All other systems reviewed and are negative.  EKGs/Labs/Other Studies Reviewed:    The following studies were reviewed today: Nuclear stress EF: 57%. The left ventricular ejection fraction is normal (55-65%). Blood pressure demonstrated a normal response to exercise. There was no ST segment deviation noted during stress. The study is normal. This is a low risk study.   Normal stress nuclear study with no ischemia or infarction.  Gated ejection fraction 57% with normal wall motion.     Recent Labs: 07/17/2020: ALT 18; BUN 12; Creatinine, Ser 0.81; Hemoglobin 13.2; Platelets 232.0; Potassium 4.3; Sodium 141; TSH 1.18  Recent Lipid Panel    Component Value Date/Time   CHOL 168 07/17/2020 1121   TRIG 103.0 07/17/2020 1121   HDL 50.90 07/17/2020 1121   CHOLHDL 3 07/17/2020 1121   VLDL 20.6 07/17/2020 1121   LDLCALC 97 07/17/2020 1121    Physical Exam:    VS:  BP 128/68   Pulse 60   Ht 5' 7.6" (1.717 m)   Wt 221 lb 0.6 oz (100.3 kg)   LMP 04/17/2011   SpO2 96%   BMI 34.01 kg/m     Wt Readings from Last 3 Encounters:  09/18/20 221 lb 0.6 oz (100.3 kg)  08/21/20 221 lb (100.2 kg)  07/25/20 221 lb 1.9 oz (100.3 kg)     GEN: Patient is in no acute distress HEENT:  Normal NECK: No JVD; No carotid bruits LYMPHATICS: No lymphadenopathy CARDIAC: Hear sounds regular, 2/6 systolic murmur at the apex. RESPIRATORY:  Clear to auscultation without rales, wheezing or rhonchi  ABDOMEN: Soft, non-tender, non-distended MUSCULOSKELETAL:  No edema; No deformity  SKIN: Warm and dry NEUROLOGIC:  Alert and oriented x 3 PSYCHIATRIC:  Normal affect   Signed, Jenean Lindau, MD  09/18/2020 8:45 AM    [place]

## 2020-09-18 NOTE — Patient Instructions (Signed)
Medication Instructions:  No medication changes. *If you need a refill on your cardiac medications before your next appointment, please call your pharmacy*   Lab Work: None ordered If you have labs (blood work) drawn today and your tests are completely normal, you will receive your results only by: [place] (if you have MyChart) OR A paper copy in the mail If you have any lab test that is abnormal or we need to change your treatment, we will call you to review the results.   Testing/Procedures: Your cardiac CT will be scheduled at:   [facility] [place], [place] [address] [phone]   If scheduled at [facility], please arrive at the [facility] main entrance of [facility] 30 minutes prior to test start time. Proceed to the [facility] Radiology Department (first floor) to check-in and test prep.  Please follow these instructions carefully (unless otherwise directed):   On the Night Before the Test: Be sure to Drink plenty of water. Do not consume any caffeinated/decaffeinated beverages or chocolate 12 hours prior to your test. Do not take any antihistamines 12 hours prior to your test.  On the Day of the Test: Drink plenty of water. Do not drink any water within one hour of the test. Do not eat any food 4 hours prior to the test. You may take your regular medications prior to the test.  FEMALES- please wear underwire-free bra if available       After the Test: Drink plenty of water. After receiving IV contrast, you may experience a mild flushed feeling. This is normal. On occasion, you may experience a mild rash up to 24 hours after the test. This is not dangerous. If this occurs, you can take Benadryl 25 mg and increase your fluid intake. If you experience trouble breathing, this can be serious. If it is severe call 911 IMMEDIATELY. If it is mild, please call our office.   Once we have confirmed authorization from  your insurance company, we will call you to set up a date and time for your test. Based on how quickly your insurance processes prior authorizations requests, please allow up to 4 weeks to be contacted for scheduling your Cardiac CT appointment. Be advised that routine Cardiac CT appointments could be scheduled as many as 8 weeks after your provider has ordered it.  For non-scheduling related questions, please contact the cardiac imaging nurse navigator should you have any questions/concerns: Marchia Bond, Cardiac Imaging Nurse Navigator Burley Saver, Interim Cardiac Imaging Nurse [place] [facility] Direct Office Dial: [phone]   For scheduling needs, including cancellations and rescheduling, please call Vivien Rota at [phone].     Follow-Up: At [facility], you and your health needs are our priority.  As part of our continuing mission to provide you with exceptional heart care, we have created designated Provider Care Teams.  These Care Teams include your primary Cardiologist (physician) and Advanced Practice Providers (APPs -  Physician Assistants and Nurse Practitioners) who all work together to provide you with the care you need, when you need it.  We recommend signing up for the patient portal called "MyChart".  Sign up information is provided on this After Visit Summary.  MyChart is used to connect with patients for Virtual Visits (Telemedicine).  Patients are able to view lab/test results, encounter notes, upcoming appointments, etc.  Non-urgent messages can be sent to your provider as well.   To learn more about  what you can do with MyChart, go to [email or web address].    Your next appointment:   2 month(s)  The format for your next appointment:   In Person  Provider:   Jyl Heinz, MD   Other Instructions Cardiac CT Angiogram A cardiac CT angiogram is a procedure to look at the heart and the area around the heart. It may be done to help find  the cause of chest pains or other symptoms of heart disease. During this procedure, a substance called contrast dye is injected into the blood vessels in the area to be checked. A large X-ray machine, called a CT scanner, then takes detailed pictures of the heart and the surrounding area. The procedure is also sometimes called a coronary CT angiogram, coronary artery scanning, or CTA. A cardiac CT angiogram allows the health care provider to see how well blood is flowing to and from the heart. The health care provider will be able to see if there are any problems, such as: Blockage or narrowing of the coronary arteries in the heart. Fluid around the heart. Signs of weakness or disease in the muscles, valves, and tissues of the heart. Tell a health care provider about: Any allergies you have. This is especially important if you have had a previous allergic reaction to contrast dye. All medicines you are taking, including vitamins, herbs, eye drops, creams, and over-the-counter medicines. Any blood disorders you have. Any surgeries you have had. Any medical conditions you have. Whether you are pregnant or may be pregnant. Any anxiety disorders, chronic pain, or other conditions you have that may increase your stress or prevent you from lying still. What are the risks? Generally, this is a safe procedure. However, problems may occur, including: Bleeding. Infection. Allergic reactions to medicines or dyes. Damage to other structures or organs. Kidney damage from the contrast dye that is used. Increased risk of cancer from radiation exposure. This risk is low. Talk with your health care provider about: The risks and benefits of testing. How you can receive the lowest dose of radiation. What happens before the procedure? Wear comfortable clothing and remove any jewelry, glasses, dentures, and hearing aids. Follow instructions from your health care provider about eating and drinking. This may  include: For 12 hours before the procedure -- avoid caffeine. This includes tea, coffee, soda, energy drinks, and diet pills. Drink plenty of water or other fluids that do not have caffeine in them. Being well hydrated can prevent complications. For 4-6 hours before the procedure -- stop eating and drinking. The contrast dye can cause nausea, but this is less likely if your stomach is empty. Ask your health care provider about changing or stopping your regular medicines. This is especially important if you are taking diabetes medicines, blood thinners, or medicines to treat problems with erections (erectile dysfunction). What happens during the procedure?  Hair on your chest may need to be removed so that small sticky patches called electrodes can be placed on your chest. These will transmit information that helps to monitor your heart during the procedure. An IV will be inserted into one of your veins. You might be given a medicine to control your heart rate during the procedure. This will help to ensure that good images are obtained. You will be asked to lie on an exam table. This table will slide in and out of the CT machine during the procedure. Contrast dye will be injected into the IV. You might feel warm, or you may get  a metallic taste in your mouth. You will be given a medicine called nitroglycerin. This will relax or dilate the arteries in your heart. The table that you are lying on will move into the CT machine tunnel for the scan. The person running the machine will give you instructions while the scans are being done. You may be asked to: Keep your arms above your head. Hold your breath. Stay very still, even if the table is moving. When the scanning is complete, you will be moved out of the machine. The IV will be removed. The procedure may vary among health care providers and hospitals. What can I expect after the procedure? After your procedure, it is common to have: A metallic  taste in your mouth from the contrast dye. A feeling of warmth. A headache from the nitroglycerin. Follow these instructions at home: Take over-the-counter and prescription medicines only as told by your health care provider. If you are told, drink enough fluid to keep your urine pale yellow. This will help to flush the contrast dye out of your body. Most people can return to their normal activities right after the procedure. Ask your health care provider what activities are safe for you. It is up to you to get the results of your procedure. Ask your health care provider, or the department that is doing the procedure, when your results will be ready. Keep all follow-up visits as told by your health care provider. This is important. Contact a health care provider if: You have any symptoms of allergy to the contrast dye. These include: Shortness of breath. Rash or hives. A racing heartbeat. Summary A cardiac CT angiogram is a procedure to look at the heart and the area around the heart. It may be done to help find the cause of chest pains or other symptoms of heart disease. During this procedure, a large X-ray machine, called a CT scanner, takes detailed pictures of the heart and the surrounding area after a contrast dye has been injected into blood vessels in the area. Ask your health care provider about changing or stopping your regular medicines before the procedure. This is especially important if you are taking diabetes medicines, blood thinners, or medicines to treat erectile dysfunction. If you are told, drink enough fluid to keep your urine pale yellow. This will help to flush the contrast dye out of your body. This information is not intended to replace advice given to you by your health care provider. Make sure you discuss any questions you have with your health care provider. Document Revised: 10/07/2018 Document Reviewed: 10/07/2018 Elsevier Patient Education  [place].

## 2020-09-18 NOTE — Progress Notes (Signed)
No lopressor ordered as her heart rate is 60. Hold per Dr. Geraldo Pitter.

## 2020-09-22 ENCOUNTER — Telehealth (HOSPITAL_COMMUNITY): Payer: Self-pay | Admitting: Emergency Medicine

## 2020-09-22 NOTE — Telephone Encounter (Signed)
Reaching out to patient to offer assistance regarding upcoming cardiac imaging study; pt verbalizes understanding of appt date/time, parking situation and where to check in, pre-test NPO status and medications ordered, and verified current allergies; name and call back number provided for further questions should they arise Erin Bond RN Navigator Cardiac Imaging [facility] [phone] office [phone] cell  Denies IV issues Denies claustro

## 2020-09-22 NOTE — Telephone Encounter (Signed)
Attempted to call patient regarding upcoming cardiac CT appointment. °Left message on voicemail with name and callback number °Makyle Eslick RN Navigator Cardiac Imaging °[facility] °[phone] Office °[phone] Cell ° °

## 2020-09-25 ENCOUNTER — Other Ambulatory Visit: Payer: Self-pay

## 2020-09-25 ENCOUNTER — Ambulatory Visit (HOSPITAL_COMMUNITY)
Admission: RE | Admit: 2020-09-25 | Discharge: 2020-09-25 | Disposition: A | Discharge status: Home / Self Care | Payer: BC Managed Care – PPO | Source: Ambulatory Visit | Attending: Cardiology | Admitting: Cardiology

## 2020-09-25 DIAGNOSIS — I209 Angina pectoris, unspecified: Secondary | ICD-10-CM

## 2020-09-25 DIAGNOSIS — I259 Chronic ischemic heart disease, unspecified: Secondary | ICD-10-CM | POA: Insufficient documentation

## 2020-09-25 MED ORDER — IOHEXOL 350 MG/ML SOLN
95.0000 mL | Freq: Once | INTRAVENOUS | Status: AC | PRN
  Administered 2020-09-25: 95 mL via INTRAVENOUS

## 2020-09-25 MED ORDER — NITROGLYCERIN 0.4 MG SL SUBL
SUBLINGUAL_TABLET | SUBLINGUAL | Status: AC
  Filled 2020-09-25: qty 2

## 2020-09-25 MED ORDER — NITROGLYCERIN 0.4 MG SL SUBL
0.8000 mg | SUBLINGUAL_TABLET | Freq: Once | SUBLINGUAL | Status: AC
  Administered 2020-09-25: 0.8 mg via SUBLINGUAL

## 2020-09-26 ENCOUNTER — Telehealth: Payer: Self-pay

## 2020-09-26 NOTE — Telephone Encounter (Signed)
-----   Message from Jenean Lindau, MD sent at 09/26/2020  2:58 PM EDT ----- The results of the study is unremarkable. Please inform patient. I will discuss in detail at next appointment. Cc  primary care/referring physician Jenean Lindau, MD 09/26/2020 2:58 PM

## 2020-09-26 NOTE — Telephone Encounter (Signed)
Spoke with patient regarding results and recommendation.  Patient verbalizes understanding and is agreeable to plan of care. Advised patient to call back with any issues or concerns.  

## 2020-12-25 ENCOUNTER — Ambulatory Visit: Payer: BC Managed Care – PPO | Admitting: Cardiology

## 2022-01-04 ENCOUNTER — Ambulatory Visit: Payer: BC Managed Care – PPO | Admitting: Family

## 2022-01-04 ENCOUNTER — Encounter: Payer: Self-pay | Admitting: Family

## 2022-01-04 VITALS — BP 138/88 | HR 73 | Temp 98.9°F | Resp 12 | Ht 67.0 in | Wt 226.2 lb

## 2022-01-04 DIAGNOSIS — M79642 Pain in left hand: Secondary | ICD-10-CM | POA: Diagnosis not present

## 2022-01-04 DIAGNOSIS — M79641 Pain in right hand: Secondary | ICD-10-CM | POA: Diagnosis not present

## 2022-01-04 DIAGNOSIS — L819 Disorder of pigmentation, unspecified: Secondary | ICD-10-CM | POA: Diagnosis not present

## 2022-01-04 DIAGNOSIS — R600 Localized edema: Secondary | ICD-10-CM

## 2022-01-04 LAB — CBC WITH DIFFERENTIAL/PLATELET
Basophils Absolute: 0 10*3/uL (ref 0.0–0.1)
Basophils Relative: 0.5 % (ref 0.0–3.0)
Eosinophils Absolute: 0.1 10*3/uL (ref 0.0–0.7)
Eosinophils Relative: 2.3 % (ref 0.0–5.0)
HCT: 39.4 % (ref 36.0–46.0)
Hemoglobin: 13 g/dL (ref 12.0–15.0)
Lymphocytes Relative: 31 % (ref 12.0–46.0)
Lymphs Abs: 2 10*3/uL (ref 0.7–4.0)
MCHC: 33.1 g/dL (ref 30.0–36.0)
MCV: 88 fl (ref 78.0–100.0)
Monocytes Absolute: 0.4 10*3/uL (ref 0.1–1.0)
Monocytes Relative: 6.7 % (ref 3.0–12.0)
Neutro Abs: 3.8 10*3/uL (ref 1.4–7.7)
Neutrophils Relative %: 59.5 % (ref 43.0–77.0)
Platelets: 232 10*3/uL (ref 150.0–400.0)
RBC: 4.48 Mil/uL (ref 3.87–5.11)
RDW: 13.7 % (ref 11.5–15.5)
WBC: 6.5 10*3/uL (ref 4.0–10.5)

## 2022-01-04 LAB — COMPREHENSIVE METABOLIC PANEL
ALT: 28 U/L (ref 0–35)
AST: 25 U/L (ref 0–37)
Albumin: 4.4 g/dL (ref 3.5–5.2)
Alkaline Phosphatase: 91 U/L (ref 39–117)
BUN: 13 mg/dL (ref 6–23)
CO2: 30 mEq/L (ref 19–32)
Calcium: 9.6 mg/dL (ref 8.4–10.5)
Chloride: 104 mEq/L (ref 96–112)
Creatinine, Ser: 0.81 mg/dL (ref 0.40–1.20)
GFR: 81.71 mL/min (ref >60.00)
Glucose, Bld: 83 mg/dL (ref 70–99)
Potassium: 4.3 mEq/L (ref 3.5–5.1)
Sodium: 140 mEq/L (ref 135–145)
Total Bilirubin: 0.6 mg/dL (ref 0.2–1.2)
Total Protein: 7.2 g/dL (ref 6.0–8.3)

## 2022-01-04 LAB — TSH: TSH: 1.23 u[IU]/mL (ref 0.35–5.50)

## 2022-01-04 LAB — VITAMIN B12: Vitamin B-12: 217 pg/mL (ref 211–911)

## 2022-01-04 MED ORDER — HYDROCHLOROTHIAZIDE 25 MG PO TABS: 25.0000 mg | ORAL_TABLET | Freq: Every day | ORAL | 0 refills | Status: DC | PRN

## 2022-01-04 NOTE — Progress Notes (Signed)
Erin Travis is a 56 y.o. 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 90 tablet 0   aspirin EC 81 MG tablet Take 1 tablet (81 mg total) by mouth daily. Swallow whole. 90 tablet 3   No current facility-administered medications for this visit.    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   Headache    Hemorrhoids    Hyperglycemia 09/10/2012   Nephrolithiasis 08/27/2012   Obesity (BMI 30-39.9)    BMI 30   Obesity (BMI 30.0-34.9) 07/25/2020   PONV (postoperative nausea and vomiting)    PONV (postoperative nausea and vomiting)    Seizures ([place])    caused by fever as child - no seizures since childhood, no meds   Wears glasses     Past Surgical History:  Procedure Laterality Date   ABDOMINAL HYSTERECTOMY     ABDOMINAL HYSTERECTOMY     BREAST EXCISIONAL BIOPSY Left    BREAST LUMPECTOMY WITH NEEDLE LOCALIZATION Left 06/01/2013   Procedure: BREAST LUMPECTOMY WITH NEEDLE LOCALIZATION;  Surgeon: Odis Hollingshead, MD;  Location: [facility];   Service: General;  Laterality: Left;   BREAST SURGERY     CYSTOSCOPY  04/22/2011   Procedure: CYSTOSCOPY;  Surgeon: Myra C. Hulan Fray, MD;  Location: [facility];  Service: Gynecology;  Laterality: N/A;   LAPAROSCOPY  11/2004   MULTIPLE TOOTH EXTRACTIONS     SVD     x 2   TONSILLECTOMY AND ADENOIDECTOMY     TUBAL LIGATION  07/2005   WISDOM TOOTH EXTRACTION      Family History  Problem Relation Age of Onset   Cancer Father        throat   Lung disease Mother    Cancer Paternal Grandmother        stomach   Hypertension Brother     Social History   Tobacco Use   Smoking status: Never   Smokeless tobacco: Never  Substance Use Topics   Alcohol use: No    Subjective:   Left ankle swelling x 1-2 years; feels like symptoms are more noticeable after standing for long periods of time; does stand for extended periods of time at her job; is concerned that her toes turn blue; feels like symptoms are becoming more noticeable in the past few months and wanted to get them checked out; no prior history of hypertension; did have cardiac work up in 2022 which was unremarkable;   Also complaining of bilateral hand pain- notes that  pain wakes her up at night; does repetitive motion with her hands;     Objective:  Vitals:   01/04/22 1256  BP: 138/88  Pulse: 73  Resp: 12  Temp: 98.9 F (37.2 C)  TempSrc: Oral  SpO2: 95%  Weight: 226 lb 3.2 oz (102.6 kg)  Height: 5' 7" (1.702 m)    General: Well developed, well nourished, in no acute distress  Skin : Warm and dry.  Head: Normocephalic and atraumatic  Eyes: Sclera and conjunctiva clear; pupils round and reactive to light; extraocular movements intact  Ears: External normal; canals clear; tympanic membranes normal  Oropharynx: Pink, supple. No suspicious lesions  Neck: Supple without thyromegaly, adenopathy  Lungs: Respirations unlabored; clear to auscultation bilaterally without wheeze, rales, rhonchi  CVS exam: normal rate and regular rhythm.   Extremities: No pitting edema, cyanosis, clubbing; edema is noted of left foot  Vessels: Symmetric bilaterally  Neurologic: Alert and oriented; speech intact; face symmetrical; moves all extremities well; CNII-XII intact without focal deficit   Assessment:  1. Pedal edema   2. Discoloration of skin of lower leg   3. Bilateral hand pain     Plan:  Check CBC, CMP, B12, TSH today; will give trial of HCTZ 25 mg to use daily prn for swelling; due to subject report of discoloration of toes (normal color noted today), will go ahead and refer to vascaulr; encouraged to follow up with her PCP for re-check in 1-2 months;   Refer to orthopedic for further evaluation- ? Carpal tunnel syndrome;   No follow-ups on file.  Orders Placed This Encounter  Procedures   CBC with Differential/Platelet   Comp Met (CMET)   TSH   B12   Ambulatory referral to Orthopedic Surgery    Referral Priority:   Routine    Referral Type:   Surgical    Referral Reason:   Specialty Services Required    Requested Specialty:   Orthopedic Surgery    Number of Visits Requested:   1   Ambulatory referral to Vascular Surgery    Referral Priority:   Routine    Referral Type:   Surgical    Referral Reason:   Specialty Services Required    Requested Specialty:   Vascular Surgery    Number of Visits Requested:   1    Requested Prescriptions   Signed Prescriptions Disp Refills   hydrochlorothiazide (HYDRODIURIL) 25 MG tablet 90 tablet 0    Sig: Take 1 tablet (25 mg total) by mouth daily as needed.

## 2022-01-15 ENCOUNTER — Encounter: Payer: Self-pay | Admitting: Orthopaedic Surgery

## 2022-01-15 ENCOUNTER — Ambulatory Visit: Payer: BC Managed Care – PPO | Admitting: Orthopaedic Surgery

## 2022-01-15 DIAGNOSIS — G5603 Carpal tunnel syndrome, bilateral upper limbs: Secondary | ICD-10-CM | POA: Diagnosis not present

## 2022-01-15 DIAGNOSIS — G5602 Carpal tunnel syndrome, left upper limb: Secondary | ICD-10-CM

## 2022-01-15 DIAGNOSIS — G5601 Carpal tunnel syndrome, right upper limb: Secondary | ICD-10-CM | POA: Diagnosis not present

## 2022-01-15 NOTE — Progress Notes (Signed)
Office Visit Note   Patient: Erin Travis           Date of Birth: 06/04/1965           MRN: [id number] Visit Date: 01/15/2022              Requested by: Marrian Salvage, [address] [place],  [place] [address] PCP: Mackie Pai, PA-C   Assessment & Plan: Visit Diagnoses:  1. Right carpal tunnel syndrome   2. Left carpal tunnel syndrome     Plan: Impression is bilateral carpal tunnel syndrome.  Disease condition and treatment options were reviewed.  We will obtain nerve conduction studies to assess the severity.  Follow-up after the nerve studies.  Follow-Up Instructions: No follow-ups on file.   Orders:  No orders of the defined types were placed in this encounter.  No orders of the defined types were placed in this encounter.     Procedures: No procedures performed   Clinical Data: No additional findings.   Subjective: Chief Complaint  Patient presents with   Right Hand - Pain, Numbness    HPI Erin Travis is a 56 year old female here for evaluation of bilateral hand symptoms of falling asleep and pain for about 4 months.  Works third shift at [employer].  Right-hand-dominant.  Has worn at nighttime braces without significant relief. Review of Systems  Constitutional: Negative.   HENT: Negative.    Eyes: Negative.   Respiratory: Negative.    Cardiovascular: Negative.   Endocrine: Negative.   Musculoskeletal: Negative.   Neurological: Negative.   Hematological: Negative.   Psychiatric/Behavioral: Negative.    All other systems reviewed and are negative.    Objective: Vital Signs: LMP 04/17/2011   Physical Exam Vitals and nursing note reviewed.  Constitutional:      Appearance: She is well-developed.  HENT:     Head: Atraumatic.     Nose: Nose normal.  Eyes:     Extraocular Movements: Extraocular movements intact.  Cardiovascular:     Pulses: Normal pulses.  Pulmonary:     Effort: Pulmonary effort is normal.   Abdominal:     Palpations: Abdomen is soft.  Musculoskeletal:     Cervical back: Neck supple.  Skin:    General: Skin is warm.     Capillary Refill: Capillary refill takes less than 2 seconds.  Neurological:     Mental Status: She is alert. Mental status is at baseline.  Psychiatric:        Behavior: Behavior normal.        Thought Content: Thought content normal.        Judgment: Judgment normal.     Ortho Exam Examination of bilateral hands show no muscle atrophy.  She can make a full composite fist.  Positive carpal tunnel compressive signs. Specialty Comments:  No specialty comments available.  Imaging: No results found.   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   Headache    Hemorrhoids    Hyperglycemia 09/10/2012   Nephrolithiasis 08/27/2012   Obesity (BMI 30-39.9)    BMI 30   Obesity (BMI 30.0-34.9) 07/25/2020   PONV (postoperative nausea and vomiting)    PONV (postoperative nausea and vomiting)    Seizures ([place])    caused by fever as child - no seizures since childhood, no meds   Wears glasses     Family History  Problem Relation Age of Onset   Cancer Father        throat   Lung disease Mother    Cancer Paternal  Grandmother        stomach   Hypertension Brother     Past Surgical History:  Procedure Laterality Date   ABDOMINAL HYSTERECTOMY     ABDOMINAL HYSTERECTOMY     BREAST EXCISIONAL BIOPSY Left    BREAST LUMPECTOMY WITH NEEDLE LOCALIZATION Left 06/01/2013   Procedure: BREAST LUMPECTOMY WITH NEEDLE LOCALIZATION;  Surgeon: Odis Hollingshead, MD;  Location: [facility];  Service: General;  Laterality: Left;   BREAST SURGERY     CYSTOSCOPY  04/22/2011   Procedure: CYSTOSCOPY;  Surgeon: Myra C. Hulan Fray, MD;  Location: [facility];  Service: Gynecology;  Laterality: N/A;   LAPAROSCOPY  11/2004   MULTIPLE TOOTH EXTRACTIONS     SVD     x 2   TONSILLECTOMY AND ADENOIDECTOMY     TUBAL LIGATION  07/2005   WISDOM TOOTH EXTRACTION     Social History   Occupational History   Not on file  Tobacco Use   Smoking status: Never   Smokeless tobacco: Never  Vaping Use   Vaping Use: Never used  Substance and Sexual Activity   Alcohol use: No   Drug use: No   Sexual activity: Never    Birth control/protection: Surgical

## 2022-01-23 ENCOUNTER — Telehealth: Payer: Self-pay | Admitting: Physical Medicine and Rehabilitation

## 2022-01-23 NOTE — Telephone Encounter (Signed)
Spoke with patient and rescheduled for 02/15/22

## 2022-01-23 NOTE — Telephone Encounter (Signed)
Patient need to reschedule appt. Please call [phone]

## 2022-02-01 ENCOUNTER — Encounter: Payer: BC Managed Care – PPO | Admitting: Physical Medicine and Rehabilitation

## 2022-02-08 ENCOUNTER — Other Ambulatory Visit: Payer: Self-pay | Admitting: *Deleted

## 2022-02-08 DIAGNOSIS — R230 Cyanosis: Secondary | ICD-10-CM

## 2022-02-15 ENCOUNTER — Ambulatory Visit: Payer: BC Managed Care – PPO | Admitting: Physical Medicine and Rehabilitation

## 2022-02-15 DIAGNOSIS — R202 Paresthesia of skin: Secondary | ICD-10-CM

## 2022-02-15 NOTE — Progress Notes (Unsigned)
Functional Pain Scale - descriptive words and definitions  No Pain (0)   No Pain/Loss of function  Average Pain 9  Right handed. Pain and numbness in both hands that starts in ring finger and goes to the thumb. Can feel pain in the palm of the hand, swelling, hard to close hands and worse at night. Dropped spoon last night

## 2022-02-16 NOTE — Procedures (Unsigned)
EMG & NCV Findings: Evaluation of the left median motor nerve showed prolonged distal onset latency (4.3 ms).  The right median motor nerve showed prolonged distal onset latency (5.5 ms) and decreased conduction velocity (Elbow-Wrist, 47 m/s).  The left median (across palm) sensory nerve showed prolonged distal peak latency (Wrist, 4.5 ms).  The right median (across palm) sensory nerve showed prolonged distal peak latency (Wrist, 6.3 ms), reduced amplitude (4.9 V), and prolonged distal peak latency (Palm, 4.2 ms).  All remaining nerves (as indicated in the following tables) were within normal limits.  Left vs. Right side comparison data for the median motor nerve indicates abnormal L-R latency difference (1.2 ms).  The ulnar motor nerve indicates abnormal L-R velocity difference (A Elbow-B Elbow, 25 m/s).  All remaining left vs. right side differences were within normal limits.    All examined muscles (as indicated in the following table) showed no evidence of electrical instability.    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   a mild to moderate left median nerve entrapment at the wrist (carpal tunnel syndrome) affecting sensory components. The above electrodiagnostic study is ABNORMAL and reveals evidence of  There is no significant electrodiagnostic evidence of any other focal nerve entrapment, brachial plexopathy or cervical radiculopathy.   Recommendations: 1.  Follow-up with referring physician. 2.  Continue current management of symptoms. 3.  Continue use of resting splint at night-time and as needed during the day. 4.  Suggest surgical evaluation.  ___________________________ Laurence Spates FAAPMR Board Certified, American Board of Physical Medicine and Rehabilitation    Nerve Conduction Studies Anti Sensory Summary Table    Stim Site NR Peak (ms) Norm Peak (ms) P-T Amp (V) Norm P-T Amp Site1 Site2 Delta-P (ms) Dist (cm) Vel (m/s) Norm Vel (m/s)  Left Median Acr Palm Anti Sensory (2nd Digit)  32.4C  Wrist    *4.5 <3.6 18.9 >10 Wrist Palm 2.6 0.0    Palm    1.9 <2.0 18.4         Right Median Acr Palm Anti Sensory (2nd Digit)  31.6C  Wrist    *6.3 <3.6 *4.9 >10 Wrist Palm 2.1 0.0    Palm    *4.2 <2.0 7.1         Left Radial Anti Sensory (Base 1st Digit)  31.9C  Wrist    1.8 <3.1 44.2  Wrist Base 1st Digit 1.8 0.0    Right Radial Anti Sensory (Base 1st Digit)  31.6C  Wrist    1.9 <3.1 33.0  Wrist Base 1st Digit 1.9 0.0    Left Ulnar Anti Sensory (5th Digit)  32.5C  Wrist    3.0 <3.7 28.6 >15.0 Wrist 5th Digit 3.0 14.0 47 >38  Right Ulnar Anti Sensory (5th Digit)  32C  Wrist    3.0 <3.7 58.4 >15.0 Wrist 5th Digit 3.0 14.0 47 >38   Motor Summary Table   Stim Site NR Onset (ms) Norm Onset (ms) O-P Amp (mV) Norm O-P Amp Site1 Site2 Delta-0 (ms) Dist (cm) Vel (m/s) Norm Vel (m/s)  Left Median Motor (Abd Poll Brev)  32.1C  Wrist    *4.3 <4.2 7.1 >5 Elbow Wrist 4.1 21.0 51 >50  Elbow    8.4  6.0         Right Median Motor (Abd Poll Brev)  31.8C  Wrist    *5.5 <4.2  7.7 >5 Elbow Wrist 4.5 21.0 *47 >50  Elbow    10.0  3.0         Left Ulnar Motor (Abd Dig Min)  32.2C  Wrist    2.8 <4.2 7.7 >3 B Elbow Wrist 3.1 20.0 65 >53  B Elbow    5.9  6.7  A Elbow B Elbow 1.0 10.0 100 >53  A Elbow    6.9  5.6         Right Ulnar Motor (Abd Dig Min)  32C  Wrist    2.7 <4.2 9.2 >3 B Elbow Wrist 3.5 21.0 60 >53  B Elbow    6.2  9.9  A Elbow B Elbow 0.8 10.0 125 >53  A Elbow    7.0  9.9          EMG   Side Muscle Nerve Root Ins Act Fibs Psw Amp Dur Poly Recrt Int Fraser Din Comment  Right Abd Poll Brev Median C8-T1 Nml Nml Nml Nml Nml 0 Nml Nml   Right 1stDorInt Ulnar C8-T1 Nml Nml Nml Nml Nml 0 Nml Nml   Right PronatorTeres Median C6-7 Nml Nml Nml Nml Nml 0 Nml Nml     Nerve Conduction Studies Anti Sensory Left/Right  Comparison   Stim Site L Lat (ms) R Lat (ms) L-R Lat (ms) L Amp (V) R Amp (V) L-R Amp (%) Site1 Site2 L Vel (m/s) R Vel (m/s) L-R Vel (m/s)  Median Acr Palm Anti Sensory (2nd Digit)  32.4C  Wrist *4.5 *6.3 1.8 18.9 *4.9 74.1 Wrist Palm     Palm 1.9 *4.2 2.3 18.4 7.1 61.4       Radial Anti Sensory (Base 1st Digit)  31.9C  Wrist 1.8 1.9 0.1 44.2 33.0 25.3 Wrist Base 1st Digit     Ulnar Anti Sensory (5th Digit)  32.5C  Wrist 3.0 3.0 0.0 28.6 58.4 51.0 Wrist 5th Digit 47 47 0   Motor Left/Right Comparison   Stim Site L Lat (ms) R Lat (ms) L-R Lat (ms) L Amp (mV) R Amp (mV) L-R Amp (%) Site1 Site2 L Vel (m/s) R Vel (m/s) L-R Vel (m/s)  Median Motor (Abd Poll Brev)  32.1C  Wrist *4.3 *5.5 *1.2 7.1 7.7 7.8 Elbow Wrist 51 *47 4  Elbow 8.4 10.0 1.6 6.0 3.0 50.0       Ulnar Motor (Abd Dig Min)  32.2C  Wrist 2.8 2.7 0.1 7.7 9.2 16.3 B Elbow Wrist 65 60 5  B Elbow 5.9 6.2 0.3 6.7 9.9 32.3 A Elbow B Elbow 100 125 *25  A Elbow 6.9 7.0 0.1 5.6 9.9 43.4          Waveforms:

## 2022-02-20 NOTE — Progress Notes (Signed)
Erin Travis - 56 y.o. female MRN [id number]  Date of birth: December 31, 1965  Office Visit Note: Visit Date: 02/15/2022 PCP: Mackie Pai, PA-C Referred by: Leandrew Koyanagi, MD  Subjective: Chief Complaint  Patient presents with   Right Hand - Pain, Numbness, Weakness   Left Hand - Pain, Numbness   HPI:  Erin Travis is a 56 y.o. female who comes in today at the request of Dr. Eduard Roux for evaluation and management of the Bilateral upper extremities.  Patient is Right hand dominant.  She describes 9 out of 10 pain on average but really with not much loss of function of her hands.  The pain numbness and tingling is in both hands right more than left historically but somewhat left more than right recently.  Patient has numbness and tingling in the hands to the ring finger on both sides more radial side digits.  She gets hand swelling and she has difficulty closing her hands at night.  She does endorse some weakness.  No frank radicular symptoms.  No prior electrodiagnostic study.     ROS Otherwise per HPI.  Assessment & Plan: Visit Diagnoses:    ICD-10-CM   1. 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   a mild to moderate left median nerve entrapment at the wrist (carpal tunnel syndrome) affecting sensory components. The above electrodiagnostic study is ABNORMAL and reveals evidence of  There is no significant electrodiagnostic evidence of any other focal nerve entrapment, brachial plexopathy or cervical radiculopathy.   Recommendations: 1.  Follow-up with referring physician. 2.  Continue current management of symptoms. 3.  Continue use of resting splint at night-time and as needed during the day. 4.  Suggest surgical evaluation.  Meds & Orders: No  orders of the defined types were placed in this encounter.   Orders Placed This Encounter  Procedures   NCV with EMG (electromyography)    Follow-up: No follow-ups on file.   Procedures: No procedures performed  EMG & NCV Findings: Evaluation of the left median motor nerve showed prolonged distal onset latency (4.3 ms).  The right median motor nerve showed prolonged distal onset latency (5.5 ms) and decreased conduction velocity (Elbow-Wrist, 47 m/s).  The left median (across palm) sensory nerve showed prolonged distal peak latency (Wrist, 4.5 ms).  The right median (across palm) sensory nerve showed prolonged distal peak latency (Wrist, 6.3 ms), reduced amplitude (4.9 V), and prolonged distal peak latency (Palm, 4.2 ms).  All remaining nerves (as indicated in the following tables) were within normal limits.  Left vs. Right side comparison data for the median motor nerve indicates abnormal L-R latency difference (1.2 ms).  The ulnar motor nerve indicates abnormal L-R velocity difference (A Elbow-B Elbow, 25 m/s).  All remaining left vs. right side differences were within normal limits.    All examined muscles (as indicated in the following table) showed no evidence of electrical instability.    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   a mild to moderate left median nerve entrapment at the wrist (carpal tunnel syndrome) affecting sensory components. The above electrodiagnostic study is ABNORMAL and reveals evidence of  There is no significant electrodiagnostic evidence of any other focal nerve entrapment, brachial plexopathy or cervical radiculopathy.   Recommendations: 1.  Follow-up with referring physician. 2.  Continue current management of symptoms. 3.  Continue use of resting splint at night-time and as needed during the  day. 4.  Suggest surgical evaluation.  ___________________________ Laurence Spates FAAPMR Board Certified, American Board of Physical Medicine and Rehabilitation    Nerve Conduction Studies Anti Sensory Summary Table   Stim Site NR Peak (ms) Norm Peak (ms) P-T Amp (V) Norm P-T Amp Site1 Site2 Delta-P (ms) Dist (cm) Vel (m/s) Norm Vel (m/s)  Left Median Acr Palm Anti Sensory (2nd Digit)  32.4C  Wrist    *4.5 <3.6 18.9 >10 Wrist Palm 2.6 0.0    Palm    1.9 <2.0 18.4         Right Median Acr Palm Anti Sensory (2nd Digit)  31.6C  Wrist    *6.3 <3.6 *4.9 >10 Wrist Palm 2.1 0.0    Palm    *4.2 <2.0 7.1         Left Radial Anti Sensory (Base 1st Digit)  31.9C  Wrist    1.8 <3.1 44.2  Wrist Base 1st Digit 1.8 0.0    Right Radial Anti Sensory (Base 1st Digit)  31.6C  Wrist    1.9 <3.1 33.0  Wrist Base 1st Digit 1.9 0.0    Left Ulnar Anti Sensory (5th Digit)  32.5C  Wrist    3.0 <3.7 28.6 >15.0 Wrist 5th Digit 3.0 14.0 47 >38  Right Ulnar Anti Sensory (5th Digit)  32C  Wrist    3.0 <3.7 58.4 >15.0 Wrist 5th Digit 3.0 14.0 47 >38   Motor Summary Table   Stim Site NR Onset (ms) Norm Onset (ms) O-P Amp (mV) Norm O-P Amp Site1 Site2 Delta-0 (ms) Dist (cm) Vel (m/s) Norm Vel (m/s)  Left Median Motor (Abd Poll Brev)  32.1C  Wrist    *4.3 <4.2 7.1 >5 Elbow Wrist 4.1 21.0 51 >50  Elbow    8.4  6.0         Right Median Motor (Abd Poll Brev)  31.8C  Wrist    *5.5 <4.2 7.7 >5 Elbow Wrist 4.5 21.0 *47 >50  Elbow    10.0  3.0         Left Ulnar Motor (Abd Dig Min)  32.2C  Wrist    2.8 <4.2 7.7 >3 B Elbow Wrist 3.1 20.0 65 >53  B Elbow    5.9  6.7  A Elbow B Elbow 1.0 10.0 100 >53  A Elbow    6.9  5.6         Right Ulnar Motor (Abd Dig Min)  32C  Wrist    2.7 <4.2 9.2 >3 B Elbow Wrist 3.5 21.0 60 >53  B Elbow    6.2  9.9  A Elbow B Elbow 0.8 10.0 125 >53  A Elbow    7.0  9.9          EMG   Side Muscle Nerve Root Ins Act Fibs Psw Amp Dur Poly Recrt Int Fraser Din Comment  Right Abd Poll Brev  Median C8-T1 Nml Nml Nml Nml Nml 0 Nml Nml   Right 1stDorInt Ulnar C8-T1 Nml Nml Nml Nml Nml 0 Nml Nml   Right PronatorTeres Median C6-7 Nml Nml Nml Nml Nml 0 Nml Nml     Nerve Conduction Studies Anti Sensory Left/Right Comparison   Stim Site L Lat (  ms) R Lat (ms) L-R Lat (ms) L Amp (V) R Amp (V) L-R Amp (%) Site1 Site2 L Vel (m/s) R Vel (m/s) L-R Vel (m/s)  Median Acr Palm Anti Sensory (2nd Digit)  32.4C  Wrist *4.5 *6.3 1.8 18.9 *4.9 74.1 Wrist Palm     Palm 1.9 *4.2 2.3 18.4 7.1 61.4       Radial Anti Sensory (Base 1st Digit)  31.9C  Wrist 1.8 1.9 0.1 44.2 33.0 25.3 Wrist Base 1st Digit     Ulnar Anti Sensory (5th Digit)  32.5C  Wrist 3.0 3.0 0.0 28.6 58.4 51.0 Wrist 5th Digit 47 47 0   Motor Left/Right Comparison   Stim Site L Lat (ms) R Lat (ms) L-R Lat (ms) L Amp (mV) R Amp (mV) L-R Amp (%) Site1 Site2 L Vel (m/s) R Vel (m/s) L-R Vel (m/s)  Median Motor (Abd Poll Brev)  32.1C  Wrist *4.3 *5.5 *1.2 7.1 7.7 7.8 Elbow Wrist 51 *47 4  Elbow 8.4 10.0 1.6 6.0 3.0 50.0       Ulnar Motor (Abd Dig Min)  32.2C  Wrist 2.8 2.7 0.1 7.7 9.2 16.3 B Elbow Wrist 65 60 5  B Elbow 5.9 6.2 0.3 6.7 9.9 32.3 A Elbow B Elbow 100 125 *25  A Elbow 6.9 7.0 0.1 5.6 9.9 43.4          Waveforms:                      Clinical History: No specialty comments available.     Objective:  VS:  HT:    WT:   BMI:     BP:   HR: bpm  TEMP: ( )  RESP:  Physical Exam Musculoskeletal:        General: No swelling, tenderness or deformity.     Comments: Inspection reveals no atrophy of the bilateral APB or FDI or hand intrinsics. There is no swelling, color changes, allodynia or dystrophic changes. There is 5 out of 5 strength in the bilateral wrist extension, finger abduction and long finger flexion. There is intact sensation to light touch in all dermatomal and peripheral nerve distributions.  There is a positive Phalen's test bilaterally. There is a negative Hoffmann's test bilaterally.   Skin:    General: Skin is warm and dry.     Findings: No erythema or rash.  Neurological:     General: No focal deficit present.     Mental Status: She is alert and oriented to person, place, and time.     Motor: No weakness or abnormal muscle tone.     Coordination: Coordination normal.  Psychiatric:        Mood and Affect: Mood normal.        Behavior: Behavior normal.      Imaging: No results found.

## 2022-02-21 ENCOUNTER — Ambulatory Visit (HOSPITAL_COMMUNITY)
Admission: RE | Admit: 2022-02-21 | Discharge: 2022-02-21 | Disposition: A | Discharge status: Home / Self Care | Payer: BC Managed Care – PPO | Source: Ambulatory Visit | Attending: Vascular Surgery | Admitting: Vascular Surgery

## 2022-02-21 ENCOUNTER — Ambulatory Visit: Payer: BC Managed Care – PPO | Admitting: Physician Assistant

## 2022-02-21 VITALS — BP 155/90 | HR 69 | Temp 97.7°F | Resp 20 | Ht 67.0 in | Wt 227.3 lb

## 2022-02-21 DIAGNOSIS — M7989 Other specified soft tissue disorders: Secondary | ICD-10-CM

## 2022-02-21 DIAGNOSIS — R230 Cyanosis: Secondary | ICD-10-CM | POA: Insufficient documentation

## 2022-02-21 DIAGNOSIS — I872 Venous insufficiency (chronic) (peripheral): Secondary | ICD-10-CM | POA: Diagnosis not present

## 2022-02-21 NOTE — Progress Notes (Addendum)
Requested by:  Marrian Salvage, [place],  [place] [address]  Reason for consultation: swelling and toes turning blue    History of Present Illness   Erin Travis is a 56 y.o. (29-Oct-1965) female who presents for evaluation of swelling in LLE and toes turning blue. She says this has been going on for over a year. She works 3rd shift at [employer] and has worked for [employer] for [place] 20 years. She has always done some position that requires prolonged standing. She explains that she has had this swelling mostly in left leg. She also gets aching after being up on her legs for a while and heaviness. She at times takes Advil to help with pain.  She has tried fluid pills but this only helped a little and then she did not feel well taking the fluid pill so she stopped. She does elevate her legs which helps. She explains that she bought OTC compression stockings at [facility] but they were so tight they made her feet hurt so she stopped wearing them. She has no history of DVT. No family history of venous disease that she is aware of.   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/2013   Headache    Hemorrhoids    Hyperglycemia 09/10/2012   Nephrolithiasis 08/27/2012   Obesity (BMI 30-39.9)    BMI 30   Obesity (BMI 30.0-34.9) 07/25/2020   PONV (postoperative nausea and vomiting)    PONV (postoperative nausea and vomiting)    Seizures ([place])     caused by fever as child - no seizures since childhood, no meds   Wears glasses     Past Surgical History:  Procedure Laterality Date   ABDOMINAL HYSTERECTOMY     ABDOMINAL HYSTERECTOMY     BREAST EXCISIONAL BIOPSY Left    BREAST LUMPECTOMY WITH NEEDLE LOCALIZATION Left 06/01/2013   Procedure: BREAST LUMPECTOMY WITH NEEDLE LOCALIZATION;  Surgeon: Odis Hollingshead, MD;  Location: [facility];  Service: General;  Laterality: Left;   BREAST SURGERY     CYSTOSCOPY  04/22/2011   Procedure: CYSTOSCOPY;  Surgeon: Myra C. Hulan Fray, MD;  Location: [facility];  Service: Gynecology;  Laterality: N/A;   LAPAROSCOPY  11/2004   MULTIPLE TOOTH EXTRACTIONS     SVD     x 2   TONSILLECTOMY AND ADENOIDECTOMY     TUBAL LIGATION  07/2005   WISDOM TOOTH EXTRACTION      Social History   Socioeconomic History   Marital status: Divorced    Spouse name: Not on file   Number of children: 2   Years of education: Not on file   Highest education level: 12th grade  Occupational History   Not on file  Tobacco Use   Smoking status: Never    Passive exposure: Never   Smokeless tobacco: Never  Vaping Use   Vaping Use: Never used  Substance and Sexual Activity  Alcohol use: No   Drug use: No   Sexual activity: Never    Birth control/protection: Surgical  Other Topics Concern   Not on file  Social History Narrative   Not on file   Social Determinants of Health   Financial Resource Strain: Not on file  Food Insecurity: Not on file  Transportation Needs: No Transportation Needs (04/28/2018)   PRAPARE - Hydrologist (Medical): No    Lack of Transportation (Non-Medical): No  Physical Activity: Inactive (01/14/2017)   Exercise Vital Sign    Days of Exercise per Week: 0 days    Minutes of Exercise per Session: 0 min  Stress: No Stress Concern Present (01/14/2017)   [place]    Feeling of Stress :  Not at all  Social Connections: Somewhat Isolated (01/14/2017)   Social Connection and Isolation Panel [NHANES]    Frequency of Communication with Friends and Family: More than three times a week    Frequency of Social Gatherings with Friends and Family: Once a week    Attends Religious Services: 1 to 4 times per year    Active Member of [employer] or Organizations: No    Attends Archivist Meetings: Never    Marital Status: Divorced  Human resources officer Violence: Not At Risk (01/14/2017)   Humiliation, Afraid, Rape, and Kick questionnaire    Fear of Current or Ex-Partner: No    Emotionally Abused: No    Physically Abused: No    Sexually Abused: No    Family History  Problem Relation Age of Onset   Cancer Father        throat   Lung disease Mother    Cancer Paternal Grandmother        stomach   Hypertension Brother     Current Outpatient Medications  Medication Sig Dispense Refill   aspirin EC 81 MG tablet Take 1 tablet (81 mg total) by mouth daily. Swallow whole. 90 tablet 3   No current facility-administered medications for this visit.    Allergies  Allergen Reactions   Latex Itching    Vaginal itching and irritation   Penicillins Itching    Has patient had a PCN reaction causing immediate rash, facial/tongue/throat swelling, SOB or lightheadedness with hypotension: Yes Has patient had a PCN reaction causing severe rash involving mucus membranes or skin necrosis: No Has patient had a PCN reaction that required hospitalization No Has patient had a PCN reaction occurring within the last 10 years: No If all of the above answers are "NO", then may proceed with Cephalosporin use.     REVIEW OF SYSTEMS (negative unless checked):   Cardiac:  '[]'$  Chest pain or chest pressure? '[]'$  Shortness of breath upon activity? '[]'$  Shortness of breath when lying flat? '[]'$  Irregular heart rhythm?  Vascular:  '[]'$  Pain in calf, thigh, or hip brought on by walking? '[]'$  Pain in feet at  night that wakes you up from your sleep? '[]'$  Blood clot in your veins? '[x]'$  Leg swelling?  Pulmonary:  '[]'$  Oxygen at home? '[]'$  Productive cough? '[]'$  Wheezing?  Neurologic:  '[]'$  Sudden weakness in arms or legs? '[]'$  Sudden numbness in arms or legs? '[]'$  Sudden onset of difficult speaking or slurred speech? '[]'$  Temporary loss of vision in one eye? '[]'$  Problems with dizziness?  Gastrointestinal:  '[]'$  Blood in stool? '[]'$  Vomited blood?  Genitourinary:  '[]'$  Burning when urinating? '[]'$  Blood in urine?  Psychiatric:  '[]'$   Major depression  Hematologic:  '[]'$  Bleeding problems? '[]'$  Problems with blood clotting?  Dermatologic:  '[]'$  Rashes or ulcers?  Constitutional:  '[]'$  Fever or chills?  Ear/Nose/Throat:  '[]'$  Change in hearing? '[]'$  Nose bleeds? '[]'$  Sore throat?  Musculoskeletal:  '[]'$  Back pain? '[]'$  Joint pain? '[]'$  Muscle pain?   Physical Examination     Vitals:   02/21/22 1339  BP: (!) 155/90  Pulse: 69  Resp: 20  Temp: 97.7 F (36.5 C)  TempSrc: Temporal  SpO2: 96%  Weight: 227 lb 4.8 oz (103.1 kg)  Height: '5\' 7"'$  (1.702 m)   Body mass index is 35.6 kg/m.  General:  WDWN in NAD; vital signs documented above Gait: Normal HENT: WNL, normocephalic Pulmonary: normal non-labored breathing , without wheezing Cardiac: regular HR Vascular Exam/Pulses:2+ DP and PT pulses bilaterally Extremities: without varicose veins, without reticular veins, spider veins on anterior mid left leg,  mild edema of bilateral ankles, without stasis pigmentation, without lipodermatosclerosis, without ulcers Musculoskeletal: no muscle wasting or atrophy  Neurologic: A&O X 3;  No focal weakness or paresthesias are detected Psychiatric:  The pt has Normal affect.  Non-invasive Vascular Imaging   BLE Venous Insufficiency Duplex (02/21/22):  LLE: No DVT and SVT GSV reflux Mid and distal thigh GSV diameter 0.47-0.68 cm No SSV reflux  CFV, FV deep venous reflux   Medical Decision Making   Clifford TERE MCCONAUGHEY is a 56 y.o. female who presents with: LLE chronic venous insufficiency with swelling and pain for over 1 year. No significant swelling appreciable in her left leg compared to right at today's visit. No cyanosis of her toes. Feet are warm with palpable pulses. Her duplex today shows no DVT or SVT. She does have deep reflux in CFV and FV. She also has superficial reflux just in mid and distal thigh. No SFJ reflux of significance. Based on her duplex today she would not be a candidate for venous ablation.  Based on the patient's history and examination, I recommend: elevation, compression stocking,s exercise therapy, refraining from prolonged sitting or standing She was measured and fitted for knee high 15-20 mmHg compression stockings She will follow up as needed if she has new or worsening symptoms   Karoline Caldwell, PA-C [facility] Office: [phone]  02/21/2022, 1:46 PM  Clinic MD: Scot Dock

## 2022-02-22 ENCOUNTER — Encounter: Payer: BC Managed Care – PPO | Admitting: Vascular Surgery

## 2022-02-22 ENCOUNTER — Encounter (HOSPITAL_COMMUNITY): Payer: BC Managed Care – PPO

## 2022-03-05 ENCOUNTER — Ambulatory Visit: Payer: BC Managed Care – PPO | Admitting: Orthopaedic Surgery

## 2022-03-19 ENCOUNTER — Ambulatory Visit: Payer: BC Managed Care – PPO | Admitting: Orthopaedic Surgery

## 2022-04-09 ENCOUNTER — Ambulatory Visit: Payer: BC Managed Care – PPO | Admitting: Orthopaedic Surgery

## 2022-04-09 ENCOUNTER — Encounter: Payer: Self-pay | Admitting: Orthopaedic Surgery

## 2022-04-09 DIAGNOSIS — Z7982 Long term (current) use of aspirin: Secondary | ICD-10-CM

## 2022-04-09 DIAGNOSIS — G5602 Carpal tunnel syndrome, left upper limb: Secondary | ICD-10-CM

## 2022-04-09 DIAGNOSIS — G5601 Carpal tunnel syndrome, right upper limb: Secondary | ICD-10-CM | POA: Diagnosis not present

## 2022-04-09 NOTE — Progress Notes (Signed)
Office Visit Note   Patient: Erin Travis           Date of Birth: 02/28/1965           MRN: [id number] Visit Date: 04/09/2022              Requested by: Mackie Pai, PA-C [address] [place],  [place] [address] PCP: Mackie Pai, PA-C   Assessment & Plan: Visit Diagnoses:  1. Right carpal tunnel syndrome     Plan: Impression is severe right and mild to moderate left CTS.  At this point, conservative treatments fail to provide any significant relief and the pain is severely affecting ADLs and quality of life.  Based on treatment options, the patient has elected to move forward with right carpal tunnel release.  We have discussed the surgical risks that include but are not limited to infection, neurovascular injury, incomplete relief of pain.  Recovery and prognosis were also reviewed.  Erin Travis will call the patient to confirm surgical time.  We will continue with conservative management for the left carpal tunnel syndrome.  Current anticoagulants: aspirin 81 mg daily Postop anticoagulation: Aspirin 81 mg Diabetic: No  Prior DVT/PE: No Tobacco use: No Clearances needed for surgery: None Anticipated discharge dispo: home   Follow-Up Instructions: No follow-ups on file.   Orders:  No orders of the defined types were placed in this encounter.  No orders of the defined types were placed in this encounter.     Procedures: No procedures performed   Clinical Data: No additional findings.   Subjective: Chief Complaint  Patient presents with   Left Wrist - Follow-up    MRI review   Right Wrist - Follow-up    MRI review    HPI Erin Travis returns today to discuss nerve conduction studies. Review of Systems   Objective: Vital Signs: LMP 04/17/2011   Physical Exam  Ortho Exam Exam of the right hand is unchanged. Specialty Comments:  No specialty comments available.  Imaging: No results found.   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 04/30/2013   Headache    Hemorrhoids    Hyperglycemia 09/10/2012   Nephrolithiasis 08/27/2012   Obesity (BMI 30-39.9)    BMI 30   Obesity (BMI 30.0-34.9) 07/25/2020   PONV (postoperative nausea and vomiting)    PONV (postoperative nausea and vomiting)    Seizures ([place])    caused by fever as child - no seizures since childhood, no meds   Wears glasses     Family History  Problem Relation Age of Onset   Cancer Father        throat   Lung disease Mother  Cancer Paternal Grandmother        stomach   Hypertension Brother     Past Surgical History:  Procedure Laterality Date   ABDOMINAL HYSTERECTOMY     ABDOMINAL HYSTERECTOMY     BREAST EXCISIONAL BIOPSY Left    BREAST LUMPECTOMY WITH NEEDLE LOCALIZATION Left 06/01/2013   Procedure: BREAST LUMPECTOMY WITH NEEDLE LOCALIZATION;  Surgeon: Odis Hollingshead, MD;  Location: [facility];  Service: General;  Laterality: Left;   BREAST SURGERY     CYSTOSCOPY  04/22/2011   Procedure: CYSTOSCOPY;  Surgeon: Myra C. Hulan Fray, MD;  Location: [facility];  Service: Gynecology;  Laterality: N/A;   LAPAROSCOPY  11/2004   MULTIPLE TOOTH EXTRACTIONS     SVD     x 2   TONSILLECTOMY AND ADENOIDECTOMY     TUBAL LIGATION   07/2005   WISDOM TOOTH EXTRACTION     Social History   Occupational History   Not on file  Tobacco Use   Smoking status: Never    Passive exposure: Never   Smokeless tobacco: Never  Vaping Use   Vaping Use: Never used  Substance and Sexual Activity   Alcohol use: No   Drug use: No   Sexual activity: Never    Birth control/protection: Surgical

## 2022-04-19 ENCOUNTER — Telehealth: Payer: Self-pay | Admitting: Orthopaedic Surgery

## 2022-04-19 NOTE — Telephone Encounter (Signed)
Pt submitted 2 medical release form. FMLA forms and short term forms will be faxed by Howell Rucks. Please start FMLA form. Pt paid $25.00 cash and will return and pay for short term disability. Accepted 04/19/22

## 2022-04-26 ENCOUNTER — Other Ambulatory Visit: Payer: Self-pay | Admitting: Physician Assistant

## 2022-04-26 MED ORDER — ONDANSETRON HCL 4 MG PO TABS: 4.0000 mg | ORAL_TABLET | Freq: Three times a day (TID) | ORAL | 0 refills | Status: DC | PRN

## 2022-04-26 MED ORDER — HYDROCODONE-ACETAMINOPHEN 5-325 MG PO TABS: 1.0000 | ORAL_TABLET | Freq: Three times a day (TID) | ORAL | 0 refills | Status: DC | PRN

## 2022-04-30 ENCOUNTER — Ambulatory Visit (INDEPENDENT_AMBULATORY_CARE_PROVIDER_SITE_OTHER): Payer: BC Managed Care – PPO

## 2022-04-30 ENCOUNTER — Encounter: Payer: Self-pay | Admitting: Orthopaedic Surgery

## 2022-04-30 ENCOUNTER — Ambulatory Visit (INDEPENDENT_AMBULATORY_CARE_PROVIDER_SITE_OTHER): Payer: BC Managed Care – PPO | Admitting: Orthopaedic Surgery

## 2022-04-30 VITALS — Ht 67.5 in | Wt 227.0 lb

## 2022-04-30 DIAGNOSIS — M25561 Pain in right knee: Secondary | ICD-10-CM | POA: Diagnosis not present

## 2022-04-30 DIAGNOSIS — G8929 Other chronic pain: Secondary | ICD-10-CM | POA: Diagnosis not present

## 2022-04-30 NOTE — Progress Notes (Unsigned)
Office Visit Note   Patient: Erin Travis           Date of Birth: [birth year]           MRN: [id number] Visit Date: 04/30/2022              Requested by: Mackie Pai, PA-C [address],  [place] [address] PCP: Mackie Pai, PA-C   Assessment & Plan: Visit Diagnoses:  1. Chronic pain of right knee     Plan: Impression is right knee pain osteoarthritis versus degenerative medial meniscal tear.  Treatment options were explained and she would like to try cortisone injection but she would like to have this done in the operating theater when she is getting her carpal tunnel released.  We will make arrangements for this.  Follow-Up Instructions: No follow-ups on file.   Orders:  Orders Placed This Encounter  Procedures   XR KNEE 3 VIEW RIGHT   No orders of the defined types were placed in this encounter.     Procedures: No procedures performed   Clinical Data: No additional findings.   Subjective: Chief Complaint  Patient presents with   Right Knee - Pain    HPI  Erin Travis comes in today for evaluation of right knee pain on the medial side for about 2 years.  It feels tender to touch.  Denies any catching locking or swelling.  Denies any injuries.  She takes occasional over-the-counter medications for this.  Review of Systems   Objective: Vital Signs: Ht 5' 7.5" (1.715 m)   Wt 227 lb (103 kg)   LMP 04/17/2011   BMI 35.03 kg/m   Physical Exam  Ortho Exam  Examination shows medial joint line tenderness.  No joint effusion.  Full range of motion.  Collaterals and cruciates are stable.  Specialty Comments:  No specialty comments available.  Imaging: No results found.   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/30/2013   Headache    Hemorrhoids    Hyperglycemia 09/10/2012   Nephrolithiasis 08/27/2012   Obesity (BMI 30-39.9)    BMI 30   Obesity (BMI 30.0-34.9) 07/25/2020   PONV (postoperative nausea and vomiting)    PONV (postoperative nausea and vomiting)    Seizures ([place])    caused by fever as child - no seizures since childhood, no meds   Wears glasses     Family History  Problem Relation Age of Onset   Cancer Father        throat   Lung disease Mother    Cancer Paternal Grandmother        stomach   Hypertension Brother  Past Surgical History:  Procedure Laterality Date   ABDOMINAL HYSTERECTOMY     ABDOMINAL HYSTERECTOMY     BREAST EXCISIONAL BIOPSY Left    BREAST LUMPECTOMY WITH NEEDLE LOCALIZATION Left 06/01/2013   Procedure: BREAST LUMPECTOMY WITH NEEDLE LOCALIZATION;  Surgeon: Odis Hollingshead, MD;  Location: [facility];  Service: General;  Laterality: Left;   BREAST SURGERY     CYSTOSCOPY  04/22/2011   Procedure: CYSTOSCOPY;  Surgeon: Myra C. Hulan Fray, MD;  Location: [facility];  Service: Gynecology;  Laterality: N/A;   LAPAROSCOPY  11/2004   MULTIPLE TOOTH EXTRACTIONS     SVD     x 2   TONSILLECTOMY AND ADENOIDECTOMY     TUBAL LIGATION  07/2005   WISDOM TOOTH EXTRACTION     Social History   Occupational History   Not on file  Tobacco Use   Smoking status: Never    Passive exposure: Never   Smokeless tobacco: Never  Vaping Use   Vaping  Use: Never used  Substance and Sexual Activity   Alcohol use: No   Drug use: No   Sexual activity: Never    Birth control/protection: Surgical

## 2022-05-02 ENCOUNTER — Encounter (HOSPITAL_BASED_OUTPATIENT_CLINIC_OR_DEPARTMENT_OTHER): Payer: Self-pay

## 2022-05-02 ENCOUNTER — Ambulatory Visit (HOSPITAL_BASED_OUTPATIENT_CLINIC_OR_DEPARTMENT_OTHER): Admit: 2022-05-02 | Payer: BC Managed Care – PPO | Admitting: Orthopaedic Surgery

## 2022-05-02 DIAGNOSIS — M25562 Pain in left knee: Secondary | ICD-10-CM | POA: Diagnosis not present

## 2022-05-02 DIAGNOSIS — M25561 Pain in right knee: Secondary | ICD-10-CM | POA: Diagnosis not present

## 2022-05-02 DIAGNOSIS — G5601 Carpal tunnel syndrome, right upper limb: Secondary | ICD-10-CM | POA: Diagnosis not present

## 2022-05-02 SURGERY — CARPAL TUNNEL RELEASE
Anesthesia: IV Sedation (MBSC Only) | Site: Wrist | Laterality: Right

## 2022-05-14 ENCOUNTER — Ambulatory Visit (INDEPENDENT_AMBULATORY_CARE_PROVIDER_SITE_OTHER): Payer: BC Managed Care – PPO | Admitting: Physician Assistant

## 2022-05-14 DIAGNOSIS — G5601 Carpal tunnel syndrome, right upper limb: Secondary | ICD-10-CM | POA: Diagnosis not present

## 2022-05-14 DIAGNOSIS — Z9889 Other specified postprocedural states: Secondary | ICD-10-CM

## 2022-05-14 NOTE — Progress Notes (Signed)
Post-Op Visit Note   Patient: Erin Travis           Date of Birth: [birth year]           MRN: [id number] Visit Date: 05/14/2022 PCP: Mackie Pai, PA-C   Assessment & Plan:  Chief Complaint:  Chief Complaint  Patient presents with   Right Hand - Routine Post Op   Visit Diagnoses:  1. Right carpal tunnel syndrome   2. S/P carpal tunnel release     Plan: Patient is a pleasant 57 year old female who comes in today 2 weeks status post right carpal tunnel release for severe compression of median nerve, date of surgery 05/02/22.  She has been doing well.  She is taking Tylenol and NSAIDs for pain.  She is still having paresthesias to the ring and long fingers.  She does admit to accidentally submerging her hand in the water.  Examination of her right hand reveals a well-healed surgical incision with nylon sutures in place.  No evidence of infection or cellulitis.  Fingers warm well-perfused.  At this point, we have removed the sutures and applied Steri-Strips.  No heavy lifting or submerging her hand underwater for another 2 weeks.  She will begin nerve gliding exercises.  She would like to return to work light duty this coming Monday so I provided a work note for this.  She will wear her splint and not lift any more than 2 pounds at work.  Follow-up in 2 weeks for recheck.  Follow-Up Instructions: Return in about 2 weeks (around 05/28/2022).   Orders:  No orders of the defined types were placed in this encounter.  No orders of the defined types were placed in this encounter.   Imaging: No new imaging  PMFS History: Patient Active Problem List   Diagnosis Date Noted   Angina pectoris ([place]) 07/25/2020   Obesity (BMI 30.0-34.9) 07/25/2020   Anxiety    DUB (dysfunctional uterine bleeding)    Endometriosis    Headache    Hemorrhoids    Obesity (BMI 30-39.9)    PONV (postoperative nausea and vomiting)    Seizures (HCC)    Wears glasses    Fibroadenoma of left breast possible  phylloides tumor. 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/30/2013   Headache    Hemorrhoids    Hyperglycemia 09/10/2012   Nephrolithiasis 08/27/2012   Obesity (BMI 30-39.9)    BMI 30   Obesity (BMI 30.0-34.9) 07/25/2020   PONV (postoperative nausea and vomiting)    PONV (postoperative nausea and vomiting)    Seizures ([place])    caused by fever as child - no seizures since childhood, no meds   Wears glasses     Family History  Problem Relation Age of Onset   Cancer Father        throat   Lung disease Mother    Cancer Paternal Grandmother        stomach   Hypertension Brother     Past Surgical History:  Procedure Laterality Date   ABDOMINAL HYSTERECTOMY     ABDOMINAL HYSTERECTOMY     BREAST EXCISIONAL BIOPSY Left  BREAST LUMPECTOMY WITH NEEDLE LOCALIZATION Left 06/01/2013   Procedure: BREAST LUMPECTOMY WITH NEEDLE LOCALIZATION;  Surgeon: Odis Hollingshead, MD;  Location: [facility];  Service: General;  Laterality: Left;   BREAST SURGERY     CYSTOSCOPY  04/22/2011   Procedure: CYSTOSCOPY;  Surgeon: Myra C. Hulan Fray, MD;  Location: [facility];  Service: Gynecology;  Laterality: N/A;   LAPAROSCOPY  11/2004   MULTIPLE TOOTH EXTRACTIONS     SVD     x 2   TONSILLECTOMY AND ADENOIDECTOMY     TUBAL LIGATION  07/2005   WISDOM TOOTH EXTRACTION     Social History   Occupational History   Not on file  Tobacco Use   Smoking status: Never    Passive exposure: Never   Smokeless tobacco: Never  Vaping Use   Vaping Use: Never used  Substance and Sexual Activity   Alcohol use: No   Drug use: No   Sexual activity: Never    Birth  control/protection: Surgical

## 2022-06-07 ENCOUNTER — Encounter: Payer: Self-pay | Admitting: Orthopaedic Surgery

## 2022-06-07 ENCOUNTER — Ambulatory Visit (INDEPENDENT_AMBULATORY_CARE_PROVIDER_SITE_OTHER): Payer: BC Managed Care – PPO | Admitting: Orthopaedic Surgery

## 2022-06-07 DIAGNOSIS — Z9889 Other specified postprocedural states: Secondary | ICD-10-CM

## 2022-06-07 DIAGNOSIS — M25561 Pain in right knee: Secondary | ICD-10-CM

## 2022-06-07 DIAGNOSIS — G8929 Other chronic pain: Secondary | ICD-10-CM

## 2022-06-07 DIAGNOSIS — G5601 Carpal tunnel syndrome, right upper limb: Secondary | ICD-10-CM

## 2022-06-07 NOTE — Progress Notes (Signed)
Post-Op Visit Note   Patient: Erin Travis           Date of Birth: 07-12-1965           MRN: [id number] Visit Date: 06/07/2022 PCP: Esperanza Richters, PA-C   Assessment & Plan:  Chief Complaint:  Chief Complaint  Patient presents with   Right Wrist - Follow-up    Right carpal tunnel release 05/02/2022   Visit Diagnoses:  1. S/P carpal tunnel release   2. Right carpal tunnel syndrome   3. Chronic pain of right knee     Plan: Jaysie is 6 weeks status post right carpal tunnel release and following up for chronic right knee pain.  She is doing really well from the carpal tunnel release.  She would like to be released back to work without restrictions.  In regards to the right knee steroid injection only lasted 2 weeks.  Examination right hand shows fully healed surgical scar.  Full hand function. Examination right knee shows medial joint line tenderness.  At this point she can follow-up with [place] as needed for the right hand.  We will need an MRI of the right knee due to chronic pain.  Follow-up after the MRI.  Follow-Up Instructions: No follow-ups on file.   Orders:  No orders of the defined types were placed in this encounter.  No orders of the defined types were placed in this encounter.   Imaging: No results found.  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/30/2013   Headache    Hemorrhoids    Hyperglycemia 09/10/2012   Nephrolithiasis 08/27/2012   Obesity (BMI 30-39.9)    BMI 30   Obesity (BMI 30.0-34.9) 07/25/2020   PONV (postoperative nausea and vomiting)    PONV (postoperative nausea and vomiting)    Seizures    caused by fever as child - no seizures since childhood, no meds   Wears glasses     Family History  Problem Relation Age of Onset   Cancer Father        throat   Lung disease Mother    Cancer Paternal Grandmother        stomach   Hypertension Brother     Past Surgical History:  Procedure Laterality Date   ABDOMINAL HYSTERECTOMY     ABDOMINAL HYSTERECTOMY     BREAST EXCISIONAL BIOPSY Left    BREAST LUMPECTOMY WITH NEEDLE LOCALIZATION Left 06/01/2013   Procedure: BREAST LUMPECTOMY WITH NEEDLE LOCALIZATION;  Surgeon: Adolph Pollack, MD;  Location: [facility];  Service: General;  Laterality: Left;   BREAST SURGERY     CYSTOSCOPY  04/22/2011   Procedure: CYSTOSCOPY;  Surgeon: Myra C. Marice Potter, MD;  Location: [facility];  Service: Gynecology;  Laterality: N/A;   LAPAROSCOPY  11/2004   MULTIPLE TOOTH EXTRACTIONS     SVD     x 2   TONSILLECTOMY AND ADENOIDECTOMY     TUBAL LIGATION  07/2005   WISDOM TOOTH EXTRACTION     Social History   Occupational History   Not on file  Tobacco Use   Smoking status: Never    Passive exposure: Never   Smokeless tobacco: Never  Vaping Use   Vaping Use: Never used  Substance and Sexual Activity   Alcohol use: No   Drug use: No   Sexual activity: Never    Birth control/protection: Surgical

## 2022-06-07 NOTE — Addendum Note (Signed)
Addended by: Wendi Maya on: 06/07/2022 10:52 AM   Modules accepted: Orders

## 2022-06-10 ENCOUNTER — Telehealth: Payer: Self-pay | Admitting: Orthopaedic Surgery

## 2022-06-10 NOTE — Telephone Encounter (Signed)
Patient advising the she need to speak to someone about her MRI

## 2022-06-10 NOTE — Telephone Encounter (Signed)
Called and spoke with patient. She wanted to see if her MRI was approved. It has been approved. I gave her [facility]'s number for scheduling.

## 2022-06-29 ENCOUNTER — Ambulatory Visit
Admission: RE | Admit: 2022-06-29 | Discharge: 2022-06-29 | Disposition: A | Discharge status: Home / Self Care | Payer: BC Managed Care – PPO | Source: Ambulatory Visit | Attending: Orthopaedic Surgery | Admitting: Orthopaedic Surgery

## 2022-06-29 DIAGNOSIS — G8929 Other chronic pain: Secondary | ICD-10-CM

## 2022-06-29 DIAGNOSIS — M25561 Pain in right knee: Secondary | ICD-10-CM | POA: Diagnosis not present

## 2022-07-02 ENCOUNTER — Ambulatory Visit: Payer: BC Managed Care – PPO | Admitting: Orthopaedic Surgery

## 2022-07-02 ENCOUNTER — Encounter: Payer: Self-pay | Admitting: Orthopaedic Surgery

## 2022-07-02 DIAGNOSIS — S83241A Other tear of medial meniscus, current injury, right knee, initial encounter: Secondary | ICD-10-CM | POA: Insufficient documentation

## 2022-07-02 NOTE — Progress Notes (Signed)
Office Visit Note   Patient: Erin Travis           Date of Birth: [birth year]           MRN: [id number] Visit Date: 07/02/2022              Requested by: Esperanza Richters, PA-C [address] [place],  [place] [address] PCP: Esperanza Richters, PA-C   Assessment & Plan: Visit Diagnoses:  1. Acute medial meniscus tear of right knee, initial encounter     Plan: Impression 57 year old female with medial meniscal tear found on recent MRI.  She does have some areas of focal chondromalacia.  She understands she may have ongoing symptoms due to this after the meniscus tear has been treated.  For now she would like to move forward with knee arthroscopy and partial medial meniscectomy.  Risk benefits prognosis reviewed.  Debbie to call the patient to confirm surgery time  Follow-Up Instructions: No follow-ups on file.   Orders:  No orders of the defined types were placed in this encounter.  No orders of the defined types were placed in this encounter.     Procedures: No procedures performed   Clinical Data: No additional findings.   Subjective: Chief Complaint  Patient presents with   Right Knee - Follow-up    MRI review    HPI Erin Travis returns today to review right knee MRI scan.  Reports no changes in her symptoms. Review of Systems   Objective: Vital Signs: LMP 04/17/2011   Physical Exam  Ortho Exam Lamination of right knee shows significant pain along the medial joint line with positive McMurray's sign. Specialty Comments:  No specialty comments available.  Imaging: No results found.   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/30/2013   Headache    Hemorrhoids    Hyperglycemia 09/10/2012   Nephrolithiasis 08/27/2012   Obesity (BMI 30-39.9)    BMI 30   Obesity (BMI 30.0-34.9) 07/25/2020   PONV (postoperative nausea and vomiting)    PONV (postoperative nausea and vomiting)    Seizures (HCC)    caused by fever as child - no seizures since childhood, no meds   Wears glasses     Family History  Problem Relation Age of Onset   Cancer Father        throat   Lung disease Mother    Cancer Paternal Grandmother        stomach   Hypertension Brother     Past Surgical History:  Procedure Laterality Date   ABDOMINAL HYSTERECTOMY     ABDOMINAL HYSTERECTOMY  BREAST EXCISIONAL BIOPSY Left    BREAST LUMPECTOMY WITH NEEDLE LOCALIZATION Left 06/01/2013   Procedure: BREAST LUMPECTOMY WITH NEEDLE LOCALIZATION;  Surgeon: Adolph Pollack, MD;  Location: [facility];  Service: General;  Laterality: Left;   BREAST SURGERY     CYSTOSCOPY  04/22/2011   Procedure: CYSTOSCOPY;  Surgeon: Myra C. Marice Potter, MD;  Location: [facility];  Service: Gynecology;  Laterality: N/A;   LAPAROSCOPY  11/2004   MULTIPLE TOOTH EXTRACTIONS     SVD     x 2   TONSILLECTOMY AND ADENOIDECTOMY     TUBAL LIGATION  07/2005   WISDOM TOOTH EXTRACTION     Social History   Occupational History   Not on file  Tobacco Use   Smoking status: Never    Passive exposure: Never   Smokeless tobacco: Never  Vaping Use   Vaping Use: Never used  Substance and Sexual Activity   Alcohol  use: No   Drug use: No   Sexual activity: Never    Birth control/protection: Surgical

## 2022-07-03 ENCOUNTER — Telehealth: Payer: Self-pay | Admitting: Orthopaedic Surgery

## 2022-07-03 NOTE — Telephone Encounter (Signed)
Called and relayed information to patient.

## 2022-07-03 NOTE — Telephone Encounter (Signed)
Patient is scheduled for right knee partial medial meniscectomy on 08-15-22 at [facility].  Patient would like to know if she will be given a splint after surgery or immobilizer for the knee.  She lives in an apartment with 3 flights of stairs.  Also she would like to know when she will be able to drive and will physical therapy will be a part of her recovery (so she can make plans for transportation if needed).    PLEASE CALL PATIENT AT [phone]

## 2022-07-03 NOTE — Telephone Encounter (Signed)
Typically she does not need a splint or brace

## 2022-07-03 NOTE — Telephone Encounter (Signed)
She can drive in a couple days as long as she is not taking painkillers during the day.  I do not anticipate that she will need PT.

## 2022-08-12 ENCOUNTER — Other Ambulatory Visit: Payer: Self-pay | Admitting: Physician Assistant

## 2022-08-12 MED ORDER — ONDANSETRON HCL 4 MG PO TABS: 4.0000 mg | ORAL_TABLET | Freq: Every day | ORAL | 1 refills | Status: DC | PRN

## 2022-08-12 MED ORDER — HYDROCODONE-ACETAMINOPHEN 5-325 MG PO TABS: 1.0000 | ORAL_TABLET | Freq: Three times a day (TID) | ORAL | 0 refills | Status: DC | PRN

## 2022-08-13 ENCOUNTER — Telehealth: Payer: Self-pay | Admitting: Orthopaedic Surgery

## 2022-08-13 ENCOUNTER — Other Ambulatory Visit: Payer: Self-pay | Admitting: Physician Assistant

## 2022-08-13 NOTE — Telephone Encounter (Signed)
She will be wbat but they will provide crutches as she may n eed for  a few days

## 2022-08-13 NOTE — Telephone Encounter (Signed)
Already sent in yesterday

## 2022-08-13 NOTE — Telephone Encounter (Signed)
Advised pt

## 2022-08-13 NOTE — Telephone Encounter (Signed)
I called and advised pt. She wanted to know if she will be one crutches after surgery. Please advise

## 2022-08-13 NOTE — Telephone Encounter (Signed)
Pt called requesting her pain medications be sent in a day before her surgery which is tomorrow so she can pick they up and have them when she is discharged out of hospital. Pt pharmacy is [facility] on [address]. Pt phone number is [phone]

## 2022-08-15 ENCOUNTER — Encounter: Payer: Self-pay | Admitting: Orthopaedic Surgery

## 2022-08-15 DIAGNOSIS — M94261 Chondromalacia, right knee: Secondary | ICD-10-CM | POA: Diagnosis not present

## 2022-08-15 DIAGNOSIS — M659 Synovitis and tenosynovitis, unspecified: Secondary | ICD-10-CM | POA: Diagnosis not present

## 2022-08-15 DIAGNOSIS — Y999 Unspecified external cause status: Secondary | ICD-10-CM | POA: Diagnosis not present

## 2022-08-15 DIAGNOSIS — M948X6 Other specified disorders of cartilage, lower leg: Secondary | ICD-10-CM | POA: Diagnosis not present

## 2022-08-15 DIAGNOSIS — X58XXXA Exposure to other specified factors, initial encounter: Secondary | ICD-10-CM | POA: Diagnosis not present

## 2022-08-15 DIAGNOSIS — S83231A Complex tear of medial meniscus, current injury, right knee, initial encounter: Secondary | ICD-10-CM | POA: Diagnosis not present

## 2022-08-20 ENCOUNTER — Telehealth: Payer: Self-pay | Admitting: Orthopaedic Surgery

## 2022-08-20 NOTE — Telephone Encounter (Signed)
Yes that sounds normal to me.

## 2022-08-20 NOTE — Telephone Encounter (Signed)
Called and relayed information to patient.

## 2022-08-20 NOTE — Telephone Encounter (Signed)
Pt is s/p meniscal tear surgery 08/15/22. C/O knee locking when she applies pressure to leg. She is doing light leg exercises on side of bed and in chair. Knee is painful when this happens. She has been trying to walk a little bit w/o crutches. She is icing and elevating. Wants to know if this is a normal process?

## 2022-08-22 ENCOUNTER — Encounter: Payer: Self-pay | Admitting: Orthopaedic Surgery

## 2022-08-22 ENCOUNTER — Ambulatory Visit (INDEPENDENT_AMBULATORY_CARE_PROVIDER_SITE_OTHER): Payer: BC Managed Care – PPO | Admitting: Physician Assistant

## 2022-08-22 DIAGNOSIS — Z9889 Other specified postprocedural states: Secondary | ICD-10-CM

## 2022-08-22 NOTE — Progress Notes (Signed)
Post-Op Visit Note   Patient: Erin Travis           Date of Birth: 1965/10/17           MRN: [id number] Visit Date: 08/22/2022 PCP: Esperanza Richters, PA-C   Assessment & Plan:  Chief Complaint:  Chief Complaint  Patient presents with   Right Knee - Follow-up    Right knee scope 08/15/2022   Visit Diagnoses:  1. S/P right knee arthroscopy     Plan: Patient is a very pleasant 57 year old female who comes in today 1 week status post right knee arthroscopic debridement medial meniscus and chondroplasty, date of surgery 08/15/2022.  Note, she had grade 3 and 4 changes to the medial and patellofemoral compartments.  She is doing well.  She is only taking Tylenol and Advil for pain.  She is ambulating with crutches due to apprehension.  Examination right knee reveals to fully healed surgical portals with nylon sutures in place.  No evidence of infection or cellulitis.  Calves are soft nontender.  She is neurovascular intact distally.  Today, sutures were removed and Steri-Strips applied.  Intraoperative pictures reviewed.  I have provided the patient with a home exercise program and have also sent a referral for outpatient physical therapy to help her get started.  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  She will follow-up with [place] in 5 weeks for recheck.  Call with concerns or questions.  Follow-Up Instructions: Return in about 5 weeks (around 09/26/2022).   Orders:  No orders of the defined types were placed in this encounter.  No orders of the defined types were placed in this encounter.   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/30/2013   Headache    Hemorrhoids    Hyperglycemia 09/10/2012   Nephrolithiasis 08/27/2012   Obesity (BMI 30-39.9)    BMI 30   Obesity (BMI 30.0-34.9) 07/25/2020   PONV (postoperative nausea and vomiting)    PONV (postoperative nausea and vomiting)    Seizures (HCC)    caused by fever as child - no seizures since childhood, no meds   Wears glasses     Family History  Problem Relation Age of Onset   Cancer Father        throat   Lung disease Mother    Cancer Paternal Grandmother        stomach   Hypertension Brother     Past Surgical History:  Procedure Laterality Date   ABDOMINAL HYSTERECTOMY     ABDOMINAL  HYSTERECTOMY     BREAST EXCISIONAL BIOPSY Left    BREAST LUMPECTOMY WITH NEEDLE LOCALIZATION Left 06/01/2013   Procedure: BREAST LUMPECTOMY WITH NEEDLE LOCALIZATION;  Surgeon: Adolph Pollack, MD;  Location: [facility];  Service: General;  Laterality: Left;   BREAST SURGERY     CYSTOSCOPY  04/22/2011   Procedure: CYSTOSCOPY;  Surgeon: Myra C. Marice Potter, MD;  Location: [facility];  Service: Gynecology;  Laterality: N/A;   LAPAROSCOPY  11/2004   MULTIPLE TOOTH EXTRACTIONS     SVD     x 2   TONSILLECTOMY AND ADENOIDECTOMY     TUBAL LIGATION  07/2005   WISDOM TOOTH EXTRACTION     Social History   Occupational History   Not on file  Tobacco Use   Smoking status: Never    Passive exposure: Never   Smokeless tobacco: Never  Vaping Use   Vaping Use: Never  used  Substance and Sexual Activity   Alcohol use: No   Drug use: No   Sexual activity: Never    Birth control/protection: Surgical

## 2022-08-26 ENCOUNTER — Ambulatory Visit: Payer: BC Managed Care – PPO | Admitting: Physical Therapy

## 2022-09-26 ENCOUNTER — Ambulatory Visit (INDEPENDENT_AMBULATORY_CARE_PROVIDER_SITE_OTHER): Payer: BC Managed Care – PPO | Admitting: Physician Assistant

## 2022-09-26 ENCOUNTER — Encounter: Payer: Self-pay | Admitting: Physician Assistant

## 2022-09-26 ENCOUNTER — Telehealth: Payer: Self-pay | Admitting: Orthopaedic Surgery

## 2022-09-26 DIAGNOSIS — Z9889 Other specified postprocedural states: Secondary | ICD-10-CM

## 2022-09-26 NOTE — Telephone Encounter (Signed)
Called patient. No answer. LMOM that it was sent to her MyChart account.

## 2022-09-26 NOTE — Telephone Encounter (Signed)
Patient seen today, she is requesting a RTW note for 10/06/22 with no climbing restrictions for 6 weeks. Please provide note if okay. Thank you!

## 2022-09-26 NOTE — Telephone Encounter (Signed)
Approve

## 2022-09-26 NOTE — Progress Notes (Signed)
Post-Op Visit Note   Patient: Erin Travis           Date of Birth: 05-Apr-1965           MRN: [id number] Visit Date: 09/26/2022 PCP: Esperanza Richters, PA-C   Assessment & Plan:  Chief Complaint:  Chief Complaint  Patient presents with   Right Knee - Follow-up    Right knee scope 08/15/2022   Visit Diagnoses:  1. S/P right knee arthroscopy     Plan: Patient is a pleasant 57 year old female who comes in today approximately 6 weeks status post right knee arthroscopic debridement medial meniscus and chondroplasty date of surgery 08/15/2022.  She had grade 3 and 4 changes to the medial and patellofemoral compartments noted during surgery.  She has been doing okay.  She does note that she gets stiff, swelling and a burning sensation in the medial knee when she is on her feet for a long time.  Overall, feeling much better.  Examination of the right knee reveals no effusion.  Range of motion 0 to 125 degrees.  She is neurovascularly intact distally.  At this point, she will continue to advance with activity as tolerated.  Follow-up as needed.  Follow-Up Instructions: Return if symptoms worsen or fail to improve.   Orders:  No orders of the defined types were placed in this encounter.  No orders of the defined types were placed in this encounter.   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 04/30/2013   Headache    Hemorrhoids    Hyperglycemia 09/10/2012   Nephrolithiasis 08/27/2012   Obesity (BMI 30-39.9)    BMI 30   Obesity (BMI 30.0-34.9) 07/25/2020   PONV (postoperative nausea and vomiting)    PONV (postoperative nausea and vomiting)    Seizures (HCC)    caused by fever as child - no seizures since childhood, no meds   Wears glasses     Family History  Problem Relation Age of Onset   Cancer Father        throat   Lung disease Mother    Cancer Paternal Grandmother        stomach   Hypertension Brother     Past Surgical History:  Procedure Laterality Date   ABDOMINAL HYSTERECTOMY     ABDOMINAL HYSTERECTOMY     BREAST EXCISIONAL BIOPSY Left    BREAST LUMPECTOMY WITH NEEDLE LOCALIZATION Left 06/01/2013   Procedure: BREAST LUMPECTOMY WITH NEEDLE LOCALIZATION;  Surgeon: Adolph Pollack, MD;  Location: [facility];  Service: General;  Laterality: Left;   BREAST SURGERY  CYSTOSCOPY  04/22/2011   Procedure: CYSTOSCOPY;  Surgeon: Myra C. Marice Potter, MD;  Location: [facility];  Service: Gynecology;  Laterality: N/A;   LAPAROSCOPY  11/2004   MULTIPLE TOOTH EXTRACTIONS     SVD     x 2   TONSILLECTOMY AND ADENOIDECTOMY     TUBAL LIGATION  07/2005   WISDOM TOOTH EXTRACTION     Social History   Occupational History   Not on file  Tobacco Use   Smoking status: Never    Passive exposure: Never   Smokeless tobacco: Never  Vaping Use   Vaping status: Never Used  Substance and Sexual Activity   Alcohol use: No   Drug use: No   Sexual activity: Never    Birth control/protection: Surgical

## 2022-12-10 ENCOUNTER — Ambulatory Visit: Payer: BC Managed Care – PPO | Admitting: Orthopaedic Surgery

## 2022-12-10 ENCOUNTER — Encounter: Payer: Self-pay | Admitting: Orthopaedic Surgery

## 2022-12-10 DIAGNOSIS — Z9889 Other specified postprocedural states: Secondary | ICD-10-CM | POA: Diagnosis not present

## 2022-12-10 DIAGNOSIS — M1711 Unilateral primary osteoarthritis, right knee: Secondary | ICD-10-CM | POA: Diagnosis not present

## 2022-12-10 MED ORDER — MELOXICAM 7.5 MG PO TABS: 7.5000 mg | ORAL_TABLET | Freq: Two times a day (BID) | ORAL | 2 refills | Status: DC | PRN

## 2022-12-10 NOTE — Progress Notes (Signed)
Office Visit Note   Patient: Erin Travis           Date of Birth: 02/03/1966           MRN: [id number] Visit Date: 12/10/2022              Requested by: Esperanza Richters, PA-C [address] [place],  [place] [address] PCP: Esperanza Richters, PA-C   Assessment & Plan: Visit Diagnoses:  1. S/P right knee arthroscopy   2. Primary osteoarthritis of right knee     Plan: Erin Travis is a 57 year old female with symptoms consistent with chondromalacia and osteoarthritis.  Not reporting anything to suggest another meniscus tear.  For now she is not interested in cortisone injections.  She would like to stick to medications.  Prescribed meloxicam for her today.  Continue symptomatic treatment.  Follow-Up Instructions: No follow-ups on file.   Orders:  No orders of the defined types were placed in this encounter.  Meds ordered this encounter  Medications   meloxicam (MOBIC) 7.5 MG tablet    Sig: Take 1 tablet (7.5 mg total) by mouth 2 (two) times daily as needed for pain.    Dispense:  30 tablet    Refill:  2      Procedures: No procedures performed   Clinical Data: No additional findings.   Subjective: Chief Complaint  Patient presents with   Right Knee - Pain    HPI Erin Travis is a 57 year old female comes in for increased right knee pain for the last 6 weeks.  Denies any injuries.  Feels pain across the medial side and across the anterior aspect.  Reports frequent start up pain and stiffness.  Denies any mechanical symptoms.  She did undergo knee scope in June and we did a partial medial meniscectomy as well as chondroplasty.  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 Review of Systems  Constitutional: Negative.   HENT: Negative.    Eyes: Negative.   Respiratory: Negative.    Cardiovascular: Negative.   Endocrine: Negative.   Musculoskeletal: Negative.   Neurological: Negative.   Hematological:  Negative.   Psychiatric/Behavioral: Negative.    All other systems reviewed and are negative.    Objective: Vital Signs: LMP 04/17/2011   Physical Exam Vitals and nursing note reviewed.  Constitutional:      Appearance: She is well-developed.  HENT:     Head: Normocephalic and atraumatic.  Pulmonary:     Effort: Pulmonary effort is normal.  Abdominal:     Palpations: Abdomen is soft.  Musculoskeletal:     Cervical back: Neck supple.  Skin:    General: Skin is warm.     Capillary Refill: Capillary refill takes less than 2 seconds.  Neurological:     Mental Status: She is alert and oriented to person, place, and time.  Psychiatric:        Behavior: Behavior normal.        Thought Content: Thought content normal.        Judgment: Judgment normal.     Ortho Exam Exam of the left knee shows pain across the medial joint line without any tenderness.  No joint effusion. Specialty Comments:  No specialty comments available.  Imaging: No results found.   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/30/2013   Headache    Hemorrhoids    Hyperglycemia 09/10/2012   Nephrolithiasis 08/27/2012    Obesity (BMI 30-39.9)    BMI 30   Obesity (BMI 30.0-34.9) 07/25/2020   PONV (postoperative nausea and vomiting)    PONV (postoperative nausea and vomiting)    Seizures (HCC)    caused by fever as child - no seizures since childhood, no meds   Wears glasses     Family History  Problem Relation Age of Onset   Cancer Father        throat   Lung disease Mother    Cancer Paternal Grandmother        stomach   Hypertension Brother     Past Surgical History:  Procedure Laterality Date   ABDOMINAL HYSTERECTOMY     ABDOMINAL HYSTERECTOMY     BREAST EXCISIONAL BIOPSY Left    BREAST LUMPECTOMY WITH NEEDLE LOCALIZATION Left 06/01/2013   Procedure: BREAST LUMPECTOMY WITH NEEDLE LOCALIZATION;  Surgeon: Adolph Pollack, MD;  Location: [facility];  Service: General;  Laterality: Left;   BREAST SURGERY     CYSTOSCOPY  04/22/2011   Procedure: CYSTOSCOPY;  Surgeon: Myra C. Marice Potter, MD;  Location: [facility];  Service: Gynecology;  Laterality: N/A;   LAPAROSCOPY  11/2004   MULTIPLE TOOTH EXTRACTIONS     SVD     x 2   TONSILLECTOMY AND ADENOIDECTOMY     TUBAL LIGATION  07/2005   WISDOM TOOTH EXTRACTION     Social History   Occupational History   Not on file  Tobacco Use   Smoking status: Never    Passive exposure: Never   Smokeless tobacco: Never  Vaping Use   Vaping status: Never Used  Substance and Sexual Activity   Alcohol use: No   Drug use: No   Sexual activity: Never    Birth control/protection: Surgical

## 2022-12-16 ENCOUNTER — Other Ambulatory Visit: Payer: Self-pay | Admitting: Physician Assistant

## 2022-12-16 ENCOUNTER — Telehealth: Payer: Self-pay | Admitting: Orthopaedic Surgery

## 2022-12-16 MED ORDER — DICLOFENAC SODIUM 75 MG PO TBEC: 75.0000 mg | DELAYED_RELEASE_TABLET | Freq: Two times a day (BID) | ORAL | 2 refills | Status: DC | PRN

## 2022-12-16 NOTE — Telephone Encounter (Signed)
Sent in voltaren to pharmacy on file

## 2022-12-16 NOTE — Telephone Encounter (Signed)
Called and notified patient

## 2022-12-16 NOTE — Telephone Encounter (Signed)
Pt called in stating the Meloxicam is giving her headaches and she would like to know if he can send in something different

## 2023-03-11 ENCOUNTER — Telehealth: Payer: Self-pay | Admitting: Orthopaedic Surgery

## 2023-03-11 NOTE — Telephone Encounter (Signed)
 Patient called needing Rx refilled Diclofenac 75 mg. The number to contact patient is [phone]

## 2023-03-12 ENCOUNTER — Other Ambulatory Visit: Payer: Self-pay | Admitting: Physician Assistant

## 2023-03-12 MED ORDER — DICLOFENAC SODIUM 75 MG PO TBEC: 75.0000 mg | DELAYED_RELEASE_TABLET | Freq: Two times a day (BID) | ORAL | 2 refills | Status: AC | PRN

## 2023-03-12 NOTE — Telephone Encounter (Signed)
Called and notified patient via voicemail.  

## 2023-03-12 NOTE — Telephone Encounter (Signed)
 sent

## 2023-04-04 ENCOUNTER — Ambulatory Visit: Payer: BC Managed Care – PPO | Admitting: Orthopaedic Surgery

## 2023-04-04 ENCOUNTER — Other Ambulatory Visit (INDEPENDENT_AMBULATORY_CARE_PROVIDER_SITE_OTHER): Payer: Self-pay

## 2023-04-04 DIAGNOSIS — M25562 Pain in left knee: Secondary | ICD-10-CM

## 2023-04-04 DIAGNOSIS — G8929 Other chronic pain: Secondary | ICD-10-CM | POA: Diagnosis not present

## 2023-04-04 MED ORDER — CELECOXIB 200 MG PO CAPS: 200.0000 mg | ORAL_CAPSULE | Freq: Two times a day (BID) | ORAL | 3 refills | Status: DC

## 2023-04-04 NOTE — Progress Notes (Signed)
 Office Visit Note   Patient: Erin Travis           Date of Birth: Feb 16, 1966           MRN: [id number] Visit Date: 04/04/2023              Requested by: Dorina Loving, PA-C [address] [place],  [place] [address] PCP: Dorina Loving, PA-C   Assessment & Plan: Visit Diagnoses:  1. Chronic pain of left knee     Plan: Felcia is a 58 year old female with left knee pain concerning for medial meniscal tear.  MRI of the left knee ordered.  Follow-up afterwards.  Follow-Up Instructions: No follow-ups on file.   Orders:  Orders Placed This Encounter  Procedures   XR KNEE 3 VIEW LEFT   Meds ordered this encounter  Medications   celecoxib  (CELEBREX ) 200 MG capsule    Sig: Take 1 capsule (200 mg total) by mouth 2 (two) times daily.    Dispense:  30 capsule    Refill:  3      Procedures: No procedures performed   Clinical Data: No additional findings.   Subjective: Chief Complaint  Patient presents with   Left Knee - Pain    HPI Aariya is a 58 year old female here for evaluation of left knee pain for the last month or so.  Denies any injuries.  Reporting frequent locking and catching inside the knee. Review of Systems  Constitutional: Negative.   HENT: Negative.    Eyes: Negative.   Respiratory: Negative.    Cardiovascular: Negative.   Endocrine: Negative.   Musculoskeletal: Negative.   Neurological: Negative.   Hematological: Negative.   Psychiatric/Behavioral: Negative.    All other systems reviewed and are negative.    Objective: Vital Signs: LMP 04/17/2011   Physical Exam Vitals and nursing note reviewed.  Constitutional:      Appearance: She is well-developed.  HENT:     Head: Atraumatic.     Nose: Nose normal.  Eyes:     Extraocular Movements: Extraocular movements intact.  Cardiovascular:     Pulses: Normal pulses.  Pulmonary:     Effort: Pulmonary effort is normal.  Abdominal:     Palpations: Abdomen is soft.   Musculoskeletal:     Cervical back: Neck supple.  Skin:    General: Skin is warm.     Capillary Refill: Capillary refill takes less than 2 seconds.  Neurological:     Mental Status: She is alert. Mental status is at baseline.  Psychiatric:        Behavior: Behavior normal.        Thought Content: Thought content normal.        Judgment: Judgment normal.     Ortho Exam Examination of the left knee shows medial joint line tenderness.  She has locking sensation with range of motion.  Pain with McMurray testing.  Collaterals and cruciates are stable. Specialty Comments:  No specialty comments available.  Imaging: XR KNEE 3 VIEW LEFT Result Date: 04/04/2023 X-rays of the left knee show degenerative changes in the medial compartment with spurring.  Mildly decreased joint space narrowing.    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/30/2013   Headache    Hemorrhoids    Hyperglycemia 09/10/2012   Nephrolithiasis 08/27/2012   Obesity (BMI 30-39.9)    BMI 30   Obesity (BMI 30.0-34.9) 07/25/2020   PONV (postoperative nausea and vomiting)    PONV (postoperative nausea and vomiting)    Seizures (HCC)     caused by fever as child - no seizures since childhood, no meds   Wears glasses     Family History  Problem Relation Age of Onset   Cancer Father        throat   Lung disease Mother    Cancer Paternal Grandmother        stomach   Hypertension Brother     Past Surgical History:  Procedure Laterality Date   ABDOMINAL HYSTERECTOMY     ABDOMINAL HYSTERECTOMY     BREAST EXCISIONAL BIOPSY Left    BREAST LUMPECTOMY WITH NEEDLE LOCALIZATION Left 06/01/2013   Procedure: BREAST LUMPECTOMY WITH NEEDLE LOCALIZATION;  Surgeon: Krystal JINNY Russell, MD;  Location: [facility];  Service: General;  Laterality: Left;   BREAST SURGERY     CYSTOSCOPY  04/22/2011   Procedure: CYSTOSCOPY;  Surgeon: Myra C. Starla, MD;  Location: [facility];  Service: Gynecology;  Laterality: N/A;   LAPAROSCOPY  11/2004   MULTIPLE TOOTH EXTRACTIONS     SVD     x 2   TONSILLECTOMY AND ADENOIDECTOMY     TUBAL LIGATION  07/2005   WISDOM TOOTH EXTRACTION     Social History   Occupational History   Not on file  Tobacco Use   Smoking status: Never    Passive exposure: Never   Smokeless tobacco: Never  Vaping Use   Vaping status: Never Used  Substance and Sexual Activity   Alcohol use: No   Drug use: No   Sexual activity: Never    Birth control/protection: Surgical

## 2023-04-04 NOTE — Addendum Note (Signed)
 Addended by: Ma Munoz on: 04/04/2023 11:05 AM   Modules accepted: Orders

## 2023-04-06 ENCOUNTER — Encounter: Payer: Self-pay | Admitting: Orthopaedic Surgery

## 2023-04-07 ENCOUNTER — Other Ambulatory Visit: Payer: Self-pay

## 2023-04-07 MED ORDER — MELOXICAM 7.5 MG PO TABS: ORAL_TABLET | ORAL | 30 refills | Status: AC

## 2023-04-07 NOTE — Telephone Encounter (Signed)
 Please send in meloxicam  7.5 mg BID prn #30.  Thanks.

## 2023-04-08 ENCOUNTER — Encounter: Payer: Self-pay | Admitting: Orthopaedic Surgery

## 2023-04-19 ENCOUNTER — Ambulatory Visit
Admission: RE | Admit: 2023-04-19 | Discharge: 2023-04-19 | Disposition: A | Discharge status: Home / Self Care | Payer: BC Managed Care – PPO | Source: Ambulatory Visit | Attending: Orthopaedic Surgery | Admitting: Orthopaedic Surgery

## 2023-04-19 DIAGNOSIS — G8929 Other chronic pain: Secondary | ICD-10-CM

## 2023-05-09 ENCOUNTER — Encounter: Payer: Self-pay | Admitting: Orthopaedic Surgery

## 2023-05-09 ENCOUNTER — Ambulatory Visit: Payer: BC Managed Care – PPO | Admitting: Orthopaedic Surgery

## 2023-05-09 DIAGNOSIS — S83242A Other tear of medial meniscus, current injury, left knee, initial encounter: Secondary | ICD-10-CM | POA: Diagnosis not present

## 2023-05-09 NOTE — Progress Notes (Signed)
 Office Visit Note   Patient: Erin Travis           Date of Birth: 11-Feb-1966           MRN: [id number] Visit Date: 05/09/2023              Requested by: Esperanza Richters, PA-C [address] [place],  [place] [address] PCP: Esperanza Richters, PA-C   Assessment & Plan: Visit Diagnoses:  1. Acute medial meniscus tear of left knee, initial encounter     Plan: Sweta is a 58 year old female with a complex tear of the medial meniscus.  Moderate tricompartmental chondromalacia.  Right now if she is busy with several things in her life and family and will let us know when she is ready for surgery.  Follow-Up Instructions: No follow-ups on file.   Orders:  No orders of the defined types were placed in this encounter.  No orders of the defined types were placed in this encounter.     Procedures: No procedures performed   Clinical Data: No additional findings.   Subjective: Chief Complaint  Patient presents with   Left Knee - Follow-up    MRI review    HPI Steffany returns today for MRI review. Review of Systems  Constitutional: Negative.   HENT: Negative.    Eyes: Negative.   Respiratory: Negative.    Cardiovascular: Negative.   Endocrine: Negative.   Musculoskeletal: Negative.   Neurological: Negative.   Hematological: Negative.   Psychiatric/Behavioral: Negative.    All other systems reviewed and are negative.    Objective: Vital Signs: LMP 04/17/2011   Physical Exam  Ortho Exam Examination of the left knee is unchanged from prior visit. Specialty Comments:  No specialty comments available.  Imaging: No results found.   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/30/2013   Headache    Hemorrhoids    Hyperglycemia 09/10/2012   Nephrolithiasis 08/27/2012   Obesity (BMI 30-39.9)    BMI 30   Obesity (BMI 30.0-34.9) 07/25/2020   PONV (postoperative nausea and vomiting)    PONV (postoperative nausea and vomiting)    Seizures (HCC)    caused by fever as child - no seizures since childhood, no meds   Wears glasses     Family History  Problem Relation Age of Onset   Cancer Father        throat   Lung disease Mother    Cancer Paternal Grandmother        stomach   Hypertension Brother     Past Surgical History:  Procedure Laterality Date   ABDOMINAL  HYSTERECTOMY     ABDOMINAL HYSTERECTOMY     BREAST EXCISIONAL BIOPSY Left    BREAST LUMPECTOMY WITH NEEDLE LOCALIZATION Left 06/01/2013   Procedure: BREAST LUMPECTOMY WITH NEEDLE LOCALIZATION;  Surgeon: Adolph Pollack, MD;  Location: [facility];  Service: General;  Laterality: Left;   BREAST SURGERY     CYSTOSCOPY  04/22/2011   Procedure: CYSTOSCOPY;  Surgeon: Myra C. Marice Potter, MD;  Location: [facility];  Service: Gynecology;  Laterality: N/A;   LAPAROSCOPY  11/2004   MULTIPLE TOOTH EXTRACTIONS     SVD     x 2   TONSILLECTOMY AND ADENOIDECTOMY     TUBAL LIGATION  07/2005   WISDOM TOOTH EXTRACTION     Social History   Occupational History   Not on file  Tobacco Use   Smoking status: Never    Passive exposure: Never   Smokeless tobacco: Never  Vaping Use   Vaping status: Never  Used  Substance and Sexual Activity   Alcohol use: No   Drug use: No   Sexual activity: Never    Birth control/protection: Surgical

## 2023-12-29 ENCOUNTER — Encounter: Payer: Self-pay | Admitting: Radiology

## 2024-02-06 ENCOUNTER — Ambulatory Visit: Admitting: Medical

## 2024-02-06 ENCOUNTER — Encounter: Payer: Self-pay | Admitting: Medical

## 2024-02-06 VITALS — BP 138/82 | HR 66 | Temp 97.9°F | Resp 14 | Ht 67.5 in | Wt 226.0 lb

## 2024-02-06 DIAGNOSIS — Z1159 Encounter for screening for other viral diseases: Secondary | ICD-10-CM

## 2024-02-06 DIAGNOSIS — Z0000 Encounter for general adult medical examination without abnormal findings: Secondary | ICD-10-CM

## 2024-02-06 DIAGNOSIS — R739 Hyperglycemia, unspecified: Secondary | ICD-10-CM

## 2024-02-06 DIAGNOSIS — G629 Polyneuropathy, unspecified: Secondary | ICD-10-CM

## 2024-02-06 DIAGNOSIS — Z1231 Encounter for screening mammogram for malignant neoplasm of breast: Secondary | ICD-10-CM

## 2024-02-06 DIAGNOSIS — E669 Obesity, unspecified: Secondary | ICD-10-CM

## 2024-02-06 DIAGNOSIS — R251 Tremor, unspecified: Secondary | ICD-10-CM

## 2024-02-06 LAB — VITAMIN B12: Vitamin B-12: 232 pg/mL (ref 211–911)

## 2024-02-06 LAB — COMPREHENSIVE METABOLIC PANEL WITH GFR
ALT: 17 U/L (ref 0–35)
AST: 20 U/L (ref 0–37)
Albumin: 4.4 g/dL (ref 3.5–5.2)
Alkaline Phosphatase: 90 U/L (ref 39–117)
BUN: 13 mg/dL (ref 6–23)
CO2: 27 meq/L (ref 19–32)
Calcium: 9.3 mg/dL (ref 8.4–10.5)
Chloride: 104 meq/L (ref 96–112)
Creatinine, Ser: 0.76 mg/dL (ref 0.40–1.20)
GFR: 86.92 mL/min (ref >60.00)
Glucose, Bld: 94 mg/dL (ref 70–99)
Potassium: 3.6 meq/L (ref 3.5–5.1)
Sodium: 140 meq/L (ref 135–145)
Total Bilirubin: 0.5 mg/dL (ref 0.2–1.2)
Total Protein: 7.4 g/dL (ref 6.0–8.3)

## 2024-02-06 LAB — CBC WITH DIFFERENTIAL/PLATELET
Basophils Absolute: 0 K/uL (ref 0.0–0.1)
Basophils Relative: 0.5 % (ref 0.0–3.0)
Eosinophils Absolute: 0.1 K/uL (ref 0.0–0.7)
Eosinophils Relative: 1.7 % (ref 0.0–5.0)
HCT: 40.5 % (ref 36.0–46.0)
Hemoglobin: 13.4 g/dL (ref 12.0–15.0)
Lymphocytes Relative: 25.1 % (ref 12.0–46.0)
Lymphs Abs: 1.9 K/uL (ref 0.7–4.0)
MCHC: 33.2 g/dL (ref 30.0–36.0)
MCV: 87.4 fl (ref 78.0–100.0)
Monocytes Absolute: 0.6 K/uL (ref 0.1–1.0)
Monocytes Relative: 8.3 % (ref 3.0–12.0)
Neutro Abs: 4.9 K/uL (ref 1.4–7.7)
Neutrophils Relative %: 64.4 % (ref 43.0–77.0)
Platelets: 282 K/uL (ref 150.0–400.0)
RBC: 4.63 Mil/uL (ref 3.87–5.11)
RDW: 13.7 % (ref 11.5–15.5)
WBC: 7.7 K/uL (ref 4.0–10.5)

## 2024-02-06 LAB — LIPID PANEL
Cholesterol: 180 mg/dL (ref 0–200)
HDL: 48.5 mg/dL (ref >39.00)
LDL Cholesterol: 101 mg/dL — ABNORMAL HIGH (ref 0–99)
NonHDL: 131.78
Total CHOL/HDL Ratio: 4
Triglycerides: 153 mg/dL — ABNORMAL HIGH (ref 0.0–149.0)
VLDL: 30.6 mg/dL (ref 0.0–40.0)

## 2024-02-06 LAB — HEMOGLOBIN A1C: Hgb A1c MFr Bld: 5.9 % (ref 4.6–6.5)

## 2024-02-06 NOTE — Progress Notes (Addendum)
 Subjective:    Pt in for cpe/wellness exam.(More than 3 years since last visit)   Pt from [place]. Works for [employer]. Pt does not exercise regularly. Walks a lot at work.  Pt diet is healthy. No alcohol and no smoking.   Last mammogram done in 2022. Normal in past. No pap since hysterectomy years ago.  Last colonoscopy- done in [place]. Told needed to repeat in 10 years. In epic care everywhere found done in 2021.  Pt reports hep b vaccine update at [facility]. Also update me had shingrix series at [facility] Declines flu vaccine. Pt will get tdap at [facility].   Erin Travis is a 58 year old female who presents with burning and pain in her feet.  She has burning pain on the tops of both feet, worse with walking. Symptoms have been present for a long time but she did not seek care earlier. She denies lower back pain or radicular symptoms, though she has intermittent low back pain since epidural more than 20 years ago. She denies fatigue.  She has episodes of tremor where her hands seem to shake aggresively, including while sitting, and others have observed this. She denies a family history of tremors or Parkinson's disease.  Her medical history is notable for elevated blood sugars. Her most recent thyroid tests were normal.    Review of Systems  Constitutional:  Negative for chills and fatigue.  HENT:  Negative for congestion.   Respiratory:  Negative for choking and wheezing.   Cardiovascular:  Negative for chest pain and palpitations.  Gastrointestinal:  Negative for abdominal pain, blood in stool and nausea.  Genitourinary:  Negative for dysuria and flank pain.  Musculoskeletal:  Negative for back pain and myalgias.  Neurological:  Negative for dizziness, weakness, light-headedness and numbness.  Hematological:  Negative for adenopathy.  Psychiatric/Behavioral:  Negative for behavioral problems and dysphoric mood.     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/30/2013   Headache    Hemorrhoids    Hyperglycemia 09/10/2012   Nephrolithiasis 08/27/2012   Obesity (BMI 30-39.9)    BMI 30   Obesity (BMI 30.0-34.9) 07/25/2020   PONV (postoperative nausea and vomiting)    PONV (postoperative nausea and vomiting)    Seizures (HCC)    caused by fever as child - no seizures since childhood, no meds   Wears glasses      Social History   Socioeconomic History   Marital status: Divorced    Spouse name: Not on file   Number of children: 2   Years of education: Not on file   Highest education level: 12th grade  Occupational History   Not on file  Tobacco Use   Smoking status: Never    Passive exposure: Never   Smokeless tobacco: Never  Vaping Use   Vaping status: Never Used  Substance and Sexual Activity   Alcohol use: No   Drug use: No   Sexual activity: Never    Birth control/protection: Surgical  Other Topics Concern   Not on file  Social History Narrative   Not on file   Social Drivers of Health   Tobacco Use: Low Risk (05/09/2023)   Patient History    Smoking Tobacco Use: Never  Smokeless Tobacco Use: Never    Passive Exposure: Never  Financial Resource Strain: Not on file  Food Insecurity: Not on file  Transportation Needs: Not on file  Physical Activity: Not on file  Stress: Not on file  Social Connections: Not on file  Intimate Partner Violence: Not on file  Depression (PHQ2-9): Low Risk (02/06/2024)   Depression (PHQ2-9)    PHQ-2 Score: 0  Alcohol Screen: Not on file  Housing: Not on file  Utilities: Not on file  Health Literacy: Not on file    Past Surgical History:  Procedure Laterality Date   ABDOMINAL HYSTERECTOMY     ABDOMINAL HYSTERECTOMY     BREAST EXCISIONAL BIOPSY Left    BREAST LUMPECTOMY WITH  NEEDLE LOCALIZATION Left 06/01/2013   Procedure: BREAST LUMPECTOMY WITH NEEDLE LOCALIZATION;  Surgeon: Krystal JINNY [facility], MD;  Location: [facility];  Service: General;  Laterality: Left;   BREAST SURGERY     CYSTOSCOPY  04/22/2011   Procedure: CYSTOSCOPY;  Surgeon: Myra C. Starla, MD;  Location: [facility];  Service: Gynecology;  Laterality: N/A;   LAPAROSCOPY  11/2004   MULTIPLE TOOTH EXTRACTIONS     SVD     x 2   TONSILLECTOMY AND ADENOIDECTOMY     TUBAL LIGATION  07/2005   WISDOM TOOTH EXTRACTION      Family History  Problem Relation Age of Onset   Cancer Father        throat   Lung disease Mother    Cancer Paternal Grandmother        stomach   Hypertension Brother     Allergies[1]  Medications Ordered Prior to Encounter[2]  BP 138/82   Pulse 66   Temp 97.9 F (36.6 C) (Oral)   Resp 14   Ht 5' 7.5 (1.715 m)   Wt 226 lb (102.5 kg)   LMP 04/17/2011   SpO2 99%   BMI 34.87 kg/m        Objective:   Physical Exam  General Mental Status- Alert. General Appearance- Not in acute distress.   Skin General: Color- Normal Color. Moisture- Normal Moisture.  Neck Carotid Arteries- Normal color. Moisture- Normal Moisture. No carotid bruits. No JVD.  Chest and Lung Exam Auscultation: Breath Sounds:-Normal.  Cardiovascular Auscultation:Rythm- Regular. Murmurs & Other Heart Sounds:Auscultation of the heart reveals- No Murmurs.  Abdomen Inspection:-Inspeection Normal. Palpation/Percussion:Note:No mass. Palpation and Percussion of the abdomen reveal- Non Tender, Non Distended + BS, no rebound or guarding.    Neurologic Cranial Nerve exam:- CN III-XII intact(No nystagmus), symmetric smile. Drift Test:- No drift. Romberg Exam:- Negative.  Heal to Toe Gait exam:-Normal. Finger to Nose:- Normal/Intact Strength:- 5/5 equal and symmetric strength both upper and lower extremities.  On exam europathy Burning sensation and pain at the top of the feet,  exacerbated by walking. No lumbar radicular pain. Differential includes vitamin deficiencies or metabolic causes. - Ordered B12 and B1 levels. - Ordered A1c to assess for diabetes.       Assessment & Plan:  For you wellness exam today I have ordered cbc, cmp, hep c antibody and lipid panel..  Placed order for mammogram  Vaccines declined as had done at [facility].Ask to get us  copies. Tdap will be done at [facility]  Up to date on colonoscopy  Recommend exercise and healthy diet.  We will let you know lab results as they come in.  Follow up date appointment will be determined after lab review.     Neuropathy Burning sensation and pain  at the top of the feet, exacerbated by walking. No lumbar radicular pain. Differential includes vitamin deficiencies or metabolic causes. - Ordered B12 and B1 levels. - Ordered A1c to assess for diabetes.  Hyperglycemia Elevated blood sugar levels.  - Ordered A1c to assess three-month average blood sugar levels.  Tremor Intermittent tremors affecting hands. No family history of tremors or Parkinson's disease. Differential includes vitamin deficiencies or metabolic causes. - Ordered B12 and B1 levels. - Ordered A1c to assess for diabetes. - will follow labs and then expand work up. If worsens consider referral to neurologist.  Obesity -discussed weight loss and may rx metformin as way to help loose weight  General Health Maintenance Routine health maintenance discussed. Colonoscopy done in 2021. Vaccine records needed for update. - Requested vaccine records from pharmacy. - Advised to obtain TDAP vaccine at pharmacy and update records.  Follow up date to be determined after lab reviews    99214 charge in addition to wellness exam as pt not seen in more than 3 years and did address and discuss neuropathy, tremors, elvated sugar and discussed her desire to loose weight for obesity    [1]  Allergies Allergen Reactions   Latex Itching     Vaginal itching and irritation   Penicillins Itching    Has patient had a PCN reaction causing immediate rash, facial/tongue/throat swelling, SOB or lightheadedness with hypotension: Yes Has patient had a PCN reaction causing severe rash involving mucus membranes or skin necrosis: No Has patient had a PCN reaction that required hospitalization No Has patient had a PCN reaction occurring within the last 10 years: No If all of the above answers are NO, then may proceed with Cephalosporin use.   [2]  Current Outpatient Medications on File Prior to Visit  Medication Sig Dispense Refill   aspirin  EC 81 MG tablet Take 1 tablet (81 mg total) by mouth daily. Swallow whole. 90 tablet 3   diclofenac  (VOLTAREN ) 75 MG EC tablet Take 1 tablet (75 mg total) by mouth 2 (two) times daily as needed. (Patient not taking: Reported on 02/06/2024) 60 tablet 2   meloxicam  (MOBIC ) 7.5 MG tablet Take 1 twice daily. (Patient not taking: Reported on 02/06/2024) 30 tablet 30   No current facility-administered medications on file prior to visit.

## 2024-02-06 NOTE — Patient Instructions (Addendum)
 For you wellness exam today I have ordered cbc, cmp, hep c antibody and lipid panel..  Placed order for mammogram  Vaccines declined as had done at [facility].Ask to get us  copies. Tdap [facility] at [facility]  Up to date on colonoscopy  Recommend exercise and healthy diet.  We will let you know lab results as they come in.  Follow up date appointment will be determined after lab review.     Neuropathy Burning sensation and pain at the top of the feet, exacerbated by walking. No lumbar radicular pain. Differential includes vitamin deficiencies or metabolic causes. - Ordered B12 and B1 levels. - Ordered A1c to assess for diabetes.  Hyperglycemia Elevated blood sugar levels.  - Ordered A1c to assess three-month average blood sugar levels.  Tremor Intermittent tremors affecting hands. No family history of tremors or Parkinson's disease. Differential includes vitamin deficiencies or metabolic causes. - Ordered B12 and B1 levels. - Ordered A1c to assess for diabetes. - will follow labs and then expand work up. If worsens consider referral to neurologist.  Obesity -discussed weight loss and may rx metformin as way to help loose weight  General Health Maintenance Routine health maintenance discussed. Colonoscopy done in 2021. Vaccine records needed for update. - Requested vaccine records from pharmacy. - Advised to obtain TDAP vaccine at pharmacy and update records.  Follow up date to be determined after lab reviews  Preventive Care 57-70 Years Old, Female Preventive care refers to lifestyle choices and visits with your health care provider that can promote health and wellness. Preventive care visits are also called wellness exams. What can I expect for my preventive care visit? Counseling Your health care provider may ask you questions about your: Medical history, including: Past medical problems. Family medical history. Pregnancy history. Current health, including: Menstrual  cycle. Method of birth control. Emotional well-being. Home life and relationship well-being. Sexual activity and sexual health. Lifestyle, including: Alcohol, nicotine or tobacco, and drug use. Access to firearms. Diet, exercise, and sleep habits. Work and work astronomer. Sunscreen use. Safety issues such as seatbelt and bike helmet use. Physical exam Your health care provider will check your: Height and weight. These may be used to calculate your BMI (body mass index). BMI is a measurement that tells if you are at a healthy weight. Waist circumference. This measures the distance around your waistline. This measurement also tells if you are at a healthy weight and may help predict your risk of certain diseases, such as type 2 diabetes and high blood pressure. Heart rate and blood pressure. Body temperature. Skin for abnormal spots. What immunizations do I need?  Vaccines are usually given at various ages, according to a schedule. Your health care provider will recommend vaccines for you based on your age, medical history, and lifestyle or other factors, such as travel or where you work. What tests do I need? Screening Your health care provider may recommend screening tests for certain conditions. This may include: Lipid and cholesterol levels. Diabetes screening. This is done by checking your blood sugar (glucose) after you have not eaten for a while (fasting). Pelvic exam and Pap test. Hepatitis B test. Hepatitis C test. HIV (human immunodeficiency virus) test. STI (sexually transmitted infection) testing, if you are at risk. Lung cancer screening. Colorectal cancer screening. Mammogram. Talk with your health care provider about when you should start having regular mammograms. This may depend on whether you have a family history of breast cancer. BRCA-related cancer screening. This may be done  if you have a family history of breast, ovarian, tubal, or peritoneal cancers. Bone  density scan. This is done to screen for osteoporosis. Talk with your health care provider about your test results, treatment options, and if necessary, the need for more tests. Follow these instructions at home: Eating and drinking  Eat a diet that includes fresh fruits and vegetables, whole grains, lean protein, and low-fat dairy products. Take vitamin and mineral supplements as recommended by your health care provider. Do not drink alcohol if: Your health care provider tells you not to drink. You are pregnant, may be pregnant, or are planning to become pregnant. If you drink alcohol: Limit how much you have to 0-1 drink a day. Know how much alcohol is in your drink. In the [place] one drink equals one 12 oz bottle of beer (355 mL), one 5 oz glass of wine (148 mL), or one 1 oz glass of hard liquor (44 mL). Lifestyle Brush your teeth every morning and night with fluoride toothpaste. Floss one time each day. Exercise for at least 30 minutes 5 or more days each week. Do not use any products that contain nicotine or tobacco. These products include cigarettes, chewing tobacco, and vaping devices, such as e-cigarettes. If you need help quitting, ask your health care provider. Do not use drugs. If you are sexually active, practice safe sex. Use a condom or other form of protection to prevent STIs. If you do not wish to become pregnant, use a form of birth control. If you plan to become pregnant, see your health care provider for a prepregnancy visit. Take aspirin  only as told by your health care provider. Make sure that you understand how much to take and what form to take. Work with your health care provider to find out whether it is safe and beneficial for you to take aspirin  daily. Find healthy ways to manage stress, such as: Meditation, yoga, or listening to music. Journaling. Talking to a trusted person. Spending time with friends and family. Minimize exposure to UV radiation to reduce your  risk of skin cancer. Safety Always wear your seat belt while driving or riding in a vehicle. Do not drive: If you have been drinking alcohol. Do not ride with someone who has been drinking. When you are tired or distracted. While texting. If you have been using any mind-altering substances or drugs. Wear a helmet and other protective equipment during sports activities. If you have firearms in your house, make sure you follow all gun safety procedures. Seek help if you have been physically or sexually abused. What's next? Visit your health care provider once a year for an annual wellness visit. Ask your health care provider how often you should have your eyes and teeth checked. Stay up to date on all vaccines. This information is not intended to replace advice given to you by your health care provider. Make sure you discuss any questions you have with your health care provider. Document Revised: 08/09/2020 Document Reviewed: 08/09/2020 Elsevier Patient Education  2024 [employer].

## 2024-02-07 ENCOUNTER — Ambulatory Visit: Payer: Self-pay | Admitting: Medical

## 2024-02-07 ENCOUNTER — Inpatient Hospital Stay (HOSPITAL_BASED_OUTPATIENT_CLINIC_OR_DEPARTMENT_OTHER): Admission: RE | Admit: 2024-02-07 | Discharge: 2024-02-07 | Attending: Medical | Admitting: Medical

## 2024-02-07 DIAGNOSIS — Z1231 Encounter for screening mammogram for malignant neoplasm of breast: Secondary | ICD-10-CM

## 2024-02-07 MED ORDER — METFORMIN HCL 500 MG PO TABS: ORAL_TABLET | ORAL | 3 refills | Status: AC

## 2024-02-07 NOTE — Addendum Note (Signed)
 Addended by: DORINA DALLAS HERO on: 02/07/2024 08:19 AM   Modules accepted: Orders

## 2024-02-09 ENCOUNTER — Telehealth: Payer: Self-pay | Admitting: Medical

## 2024-02-09 ENCOUNTER — Telehealth: Payer: Self-pay

## 2024-02-09 NOTE — Telephone Encounter (Signed)
 Noted, already scheduled

## 2024-02-09 NOTE — Progress Notes (Signed)
 Last read by Madelin CHRISTELLA Hoh at 9:25AM on 02/08/2024.

## 2024-02-09 NOTE — Telephone Encounter (Signed)
 Copied from CRM [id number]. Topic: Complaint (DO NOT CONVERT) - Staff >> Feb 09, 2024  8:47 AM Antwanette L wrote: Date of Incident: 02/06/24 at 11am Details of complaint: The patient reported feeling overlooked and stated that she was not called back until 11:30. She also noted that her daughter works at the facility, and they do not get along. How would the patient like to see it resolved? Patient would like to have her medical records transferred to another facility([facility]). Patient was provided with [facility] medical records phone number. Also, the patient would like a callback from the office manager at  [phone].

## 2024-02-09 NOTE — Telephone Encounter (Signed)
 Copied from CRM #[id number]. Topic: Appointments - Transfer of Care >> Feb 09, 2024  9:03 AM Antwanette L wrote: Pt is requesting to transfer FROM: Dr.Edward Saguier Pt is requesting to transfer TO: Tinnie Harada NP Reason for requested transfer: The patient reported feeling overlooked at [facility] and is requesting to be seen at another facility  It is the responsibility of the team the patient would like to transfer to Sandor Harada) to reach out to the patient if for any reason this transfer is not acceptable.

## 2024-02-11 LAB — HEPATITIS C ANTIBODY: Hepatitis C Ab: NONREACTIVE

## 2024-02-11 LAB — VITAMIN B1: Vitamin B1 (Thiamine): 16 nmol/L (ref 8–30)

## 2024-03-26 ENCOUNTER — Encounter: Admitting: Nurse Practitioner

## 2024-03-26 NOTE — Progress Notes (Incomplete)
 "  Transfer Of Care New Patient Visit  LMP 04/17/2011    Subjective:    Patient ID: Erin Travis, female    DOB: [birth year], 59 y.o.   MRN: [id number]  CC: No chief complaint on file.   HPI: Erin Travis is a 59 y.o. female presents for new patient visit to establish care. Introduced to publishing rights manager role and practice setting. All questions answered. Discussed provider/patient relationship and expectations.  She has a history of elevated blood sugars for which she takes Metformin  500mg  BID.   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 04/30/2013   Headache    Hemorrhoids    Hyperglycemia 09/10/2012   Nephrolithiasis 08/27/2012   Obesity (BMI 30-39.9)    BMI 30   Obesity (BMI 30.0-34.9) 07/25/2020   PONV (postoperative nausea and vomiting)    PONV (postoperative nausea and vomiting)    Seizures (HCC)    caused by fever as child - no seizures since childhood, no meds   Wears glasses     Past Surgical History:  Procedure Laterality Date   ABDOMINAL HYSTERECTOMY     ABDOMINAL HYSTERECTOMY     BREAST EXCISIONAL BIOPSY Left    BREAST LUMPECTOMY WITH NEEDLE LOCALIZATION Left 06/01/2013   Procedure: BREAST LUMPECTOMY WITH NEEDLE LOCALIZATION;  Surgeon: Krystal JINNY [facility], MD;  Location: [facility];  Service: General;  Laterality: Left;   BREAST SURGERY     CYSTOSCOPY  04/22/2011   Procedure: CYSTOSCOPY;  Surgeon: Myra C. Starla, MD;  Location: [facility];  Service: Gynecology;  Laterality: N/A;   LAPAROSCOPY  11/2004   MULTIPLE TOOTH EXTRACTIONS     SVD     x 2   TONSILLECTOMY AND ADENOIDECTOMY     TUBAL LIGATION  07/2005   WISDOM TOOTH EXTRACTION      Family History  Problem Relation Age of Onset   Cancer Father        throat   Lung disease Mother     Cancer Paternal Grandmother        stomach   Hypertension Brother      Social History[1]  Medications Ordered Prior to Encounter[2]   Review of Systems     Objective:    LMP 04/17/2011   Wt Readings from Last 3 Encounters:  02/06/24 226 lb (102.5 kg)  04/30/22 227 lb (103 kg)  02/21/22 227 lb 4.8 oz (103.1 kg)    BP Readings from Last 3 Encounters:  02/06/24 138/82  02/21/22 (!) 155/90  01/04/22 138/88    Physical Exam     Assessment & Plan:   Problem List Items Addressed This Visit   None    Follow up plan: No follow-ups on file.  Erin DELENA Harada, NP  I,Erin Travis,acting as a scribe for [employer], NP.,have documented all relevant documentation on the behalf of Erin DELENA Harada, NP.  I, Erin DELENA Harada, NP, have reviewed all documentation for this visit. The documentation on 03/26/2024 for the exam, diagnosis, procedures, and orders are all accurate and complete.     [1]  Social History Tobacco Use   Smoking status: Never    Passive exposure: Never   Smokeless tobacco: Never  Vaping Use   Vaping status: Never Used  Substance  Use Topics   Alcohol use: No   Drug use: No  [2]  Current Outpatient Medications on File Prior to Visit  Medication Sig Dispense Refill   aspirin  EC 81 MG tablet Take 1 tablet (81 mg total) by mouth daily. Swallow whole. 90 tablet 3   diclofenac  (VOLTAREN ) 75 MG EC tablet Take 1 tablet (75 mg total) by mouth 2 (two) times daily as needed. (Patient not taking: Reported on 02/06/2024) 60 tablet 2   meloxicam  (MOBIC ) 7.5 MG tablet Take 1 twice daily. (Patient not taking: Reported on 02/06/2024) 30 tablet 30   metFORMIN  (GLUCOPHAGE ) 500 MG tablet 1 tab po twice daily 180 tablet 3   No current facility-administered medications on file prior to visit.   "

## 2024-03-26 NOTE — Assessment & Plan Note (Signed)
 BMI 34. Takes Metformin  500mg  BID.
